# Patient Record
Sex: Male | Born: 1965 | Race: Black or African American | Hispanic: No | Marital: Single | Smoking: Current every day smoker
Health system: Southern US, Community
[De-identification: ages and names within clinical notes are randomized; demographics above are authoritative.]

## PROBLEM LIST (undated history)

## (undated) DIAGNOSIS — K635 Polyp of colon: Secondary | ICD-10-CM

## (undated) HISTORY — PX: COLONOSCOPY W/ BIOPSIES: SHX1374

## (undated) HISTORY — DX: Polyp of colon: K63.5

---

## 2007-11-22 ENCOUNTER — Emergency Department (HOSPITAL_COMMUNITY): Admission: EM | Admit: 2007-11-22 | Discharge: 2007-11-22 | Payer: Self-pay | Admitting: Family Medicine

## 2007-12-06 ENCOUNTER — Ambulatory Visit (HOSPITAL_COMMUNITY): Admission: RE | Admit: 2007-12-06 | Discharge: 2007-12-06 | Payer: Self-pay | Admitting: Chiropractic Medicine

## 2009-05-02 ENCOUNTER — Ambulatory Visit: Payer: Self-pay | Admitting: Internal Medicine

## 2009-05-02 DIAGNOSIS — IMO0002 Reserved for concepts with insufficient information to code with codable children: Secondary | ICD-10-CM | POA: Insufficient documentation

## 2009-05-21 ENCOUNTER — Encounter: Payer: Self-pay | Admitting: Internal Medicine

## 2009-06-03 ENCOUNTER — Encounter: Admission: RE | Admit: 2009-06-03 | Discharge: 2009-07-24 | Payer: Self-pay | Admitting: Internal Medicine

## 2009-06-10 ENCOUNTER — Encounter: Payer: Self-pay | Admitting: Internal Medicine

## 2010-10-24 ENCOUNTER — Emergency Department (HOSPITAL_COMMUNITY): Admission: EM | Admit: 2010-10-24 | Discharge: 2010-10-24 | Payer: Self-pay | Admitting: Emergency Medicine

## 2010-10-30 ENCOUNTER — Encounter: Payer: Self-pay | Admitting: Internal Medicine

## 2010-11-02 ENCOUNTER — Encounter: Admission: RE | Admit: 2010-11-02 | Discharge: 2010-11-02 | Payer: Self-pay | Admitting: Neurological Surgery

## 2010-11-05 ENCOUNTER — Encounter: Payer: Self-pay | Admitting: Internal Medicine

## 2010-12-21 HISTORY — PX: LUMBAR SPINE SURGERY: SHX701

## 2011-01-20 NOTE — Letter (Signed)
Summary: [facility]  [facility]   Imported By: Maryln Gottron 11/18/2010 12:39:37  _____________________________________________________________________  External Attachment:    Type:   Image     Comment:   External Document

## 2011-01-20 NOTE — Letter (Signed)
Summary: [facility]  [facility]   Imported By: Maryln Gottron 11/24/2010 12:12:37  _____________________________________________________________________  External Attachment:    Type:   Image     Comment:   External Document

## 2011-01-20 NOTE — Consult Note (Signed)
Summary: [facility]  [facility]   Imported By: Lanelle Bal 06/05/2009 12:03:23  _____________________________________________________________________  External Attachment:    Type:   Image     Comment:   External Document

## 2011-01-20 NOTE — Miscellaneous (Signed)
Summary: Initial Summary for PT Services/[facility]  Initial Summary for PT Services/[facility]   Imported By: Maryln Gottron 06/12/2009 15:09:15  _____________________________________________________________________  External Attachment:    Type:   Image     Comment:   External Document
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
## 2011-02-20 ENCOUNTER — Emergency Department (HOSPITAL_COMMUNITY)
Admission: EM | Admit: 2011-02-20 | Discharge: 2011-02-20 | Disposition: A | Discharge status: Home / Self Care | Payer: No Typology Code available for payment source | Attending: Emergency Medicine | Admitting: Emergency Medicine

## 2011-02-20 ENCOUNTER — Emergency Department (HOSPITAL_COMMUNITY): Payer: No Typology Code available for payment source

## 2011-02-20 DIAGNOSIS — S335XXA Sprain of ligaments of lumbar spine, initial encounter: Secondary | ICD-10-CM | POA: Insufficient documentation

## 2011-02-20 DIAGNOSIS — Z9889 Other specified postprocedural states: Secondary | ICD-10-CM | POA: Insufficient documentation

## 2011-02-20 DIAGNOSIS — Y92410 Unspecified street and highway as the place of occurrence of the external cause: Secondary | ICD-10-CM | POA: Insufficient documentation

## 2011-03-03 ENCOUNTER — Other Ambulatory Visit: Payer: Self-pay | Admitting: Neurosurgery

## 2011-03-03 DIAGNOSIS — M549 Dorsalgia, unspecified: Secondary | ICD-10-CM

## 2011-03-21 ENCOUNTER — Ambulatory Visit
Admission: RE | Admit: 2011-03-21 | Discharge: 2011-03-21 | Disposition: A | Discharge status: Home / Self Care | Payer: No Typology Code available for payment source | Source: Ambulatory Visit | Attending: Neurosurgery | Admitting: Neurosurgery

## 2011-03-21 ENCOUNTER — Emergency Department (HOSPITAL_BASED_OUTPATIENT_CLINIC_OR_DEPARTMENT_OTHER)
Admission: EM | Admit: 2011-03-21 | Discharge: 2011-03-21 | Disposition: A | Discharge status: Home / Self Care | Payer: 59 | Attending: Emergency Medicine | Admitting: Emergency Medicine

## 2011-03-21 DIAGNOSIS — M549 Dorsalgia, unspecified: Secondary | ICD-10-CM

## 2011-03-21 DIAGNOSIS — W57XXXA Bitten or stung by nonvenomous insect and other nonvenomous arthropods, initial encounter: Secondary | ICD-10-CM | POA: Insufficient documentation

## 2011-03-21 DIAGNOSIS — S90569A Insect bite (nonvenomous), unspecified ankle, initial encounter: Secondary | ICD-10-CM | POA: Insufficient documentation

## 2011-03-21 DIAGNOSIS — F17200 Nicotine dependence, unspecified, uncomplicated: Secondary | ICD-10-CM | POA: Insufficient documentation

## 2011-03-21 MED ORDER — GADOBENATE DIMEGLUMINE 529 MG/ML IV SOLN
19.0000 mL | Freq: Once | INTRAVENOUS | Status: AC | PRN
  Administered 2011-03-21: 19 mL via INTRAVENOUS

## 2011-06-09 ENCOUNTER — Ambulatory Visit: Payer: No Typology Code available for payment source | Admitting: Physical Therapy

## 2011-06-22 ENCOUNTER — Ambulatory Visit: Payer: 59

## 2011-07-16 ENCOUNTER — Ambulatory Visit: Payer: 59 | Attending: Neurosurgery

## 2011-07-16 DIAGNOSIS — IMO0001 Reserved for inherently not codable concepts without codable children: Secondary | ICD-10-CM | POA: Insufficient documentation

## 2011-07-16 DIAGNOSIS — M545 Low back pain, unspecified: Secondary | ICD-10-CM | POA: Insufficient documentation

## 2011-07-16 DIAGNOSIS — R293 Abnormal posture: Secondary | ICD-10-CM | POA: Insufficient documentation

## 2011-07-16 DIAGNOSIS — M79609 Pain in unspecified limb: Secondary | ICD-10-CM | POA: Insufficient documentation

## 2011-07-22 ENCOUNTER — Ambulatory Visit: Payer: 59 | Attending: Neurosurgery | Admitting: Physical Therapy

## 2011-07-22 DIAGNOSIS — M545 Low back pain, unspecified: Secondary | ICD-10-CM | POA: Insufficient documentation

## 2011-07-22 DIAGNOSIS — IMO0001 Reserved for inherently not codable concepts without codable children: Secondary | ICD-10-CM | POA: Insufficient documentation

## 2011-07-22 DIAGNOSIS — R293 Abnormal posture: Secondary | ICD-10-CM | POA: Insufficient documentation

## 2011-07-22 DIAGNOSIS — M79609 Pain in unspecified limb: Secondary | ICD-10-CM | POA: Insufficient documentation

## 2011-07-29 ENCOUNTER — Encounter: Payer: 59 | Admitting: Physical Therapy

## 2011-07-31 ENCOUNTER — Encounter: Payer: 59 | Admitting: Physical Therapy

## 2011-09-07 ENCOUNTER — Ambulatory Visit: Payer: 59 | Attending: Neurological Surgery | Admitting: Rehabilitative and Restorative Service Providers"

## 2011-09-07 DIAGNOSIS — R293 Abnormal posture: Secondary | ICD-10-CM | POA: Insufficient documentation

## 2011-09-07 DIAGNOSIS — M545 Low back pain, unspecified: Secondary | ICD-10-CM | POA: Insufficient documentation

## 2011-09-07 DIAGNOSIS — M79609 Pain in unspecified limb: Secondary | ICD-10-CM | POA: Insufficient documentation

## 2011-09-07 DIAGNOSIS — IMO0001 Reserved for inherently not codable concepts without codable children: Secondary | ICD-10-CM | POA: Insufficient documentation

## 2011-09-14 ENCOUNTER — Ambulatory Visit: Payer: 59 | Admitting: Rehabilitative and Restorative Service Providers"

## 2011-09-16 ENCOUNTER — Ambulatory Visit: Payer: 59 | Admitting: Rehabilitative and Restorative Service Providers"

## 2011-09-16 ENCOUNTER — Other Ambulatory Visit: Payer: Self-pay | Admitting: Neurosurgery

## 2011-09-16 DIAGNOSIS — M549 Dorsalgia, unspecified: Secondary | ICD-10-CM

## 2011-09-16 DIAGNOSIS — M79606 Pain in leg, unspecified: Secondary | ICD-10-CM

## 2011-09-19 ENCOUNTER — Other Ambulatory Visit: Payer: 59

## 2011-09-22 ENCOUNTER — Ambulatory Visit: Payer: 59 | Attending: Neurological Surgery | Admitting: Rehabilitative and Restorative Service Providers"

## 2011-09-22 DIAGNOSIS — M79609 Pain in unspecified limb: Secondary | ICD-10-CM | POA: Insufficient documentation

## 2011-09-22 DIAGNOSIS — M545 Low back pain, unspecified: Secondary | ICD-10-CM | POA: Insufficient documentation

## 2011-09-22 DIAGNOSIS — R293 Abnormal posture: Secondary | ICD-10-CM | POA: Insufficient documentation

## 2011-09-22 DIAGNOSIS — IMO0001 Reserved for inherently not codable concepts without codable children: Secondary | ICD-10-CM | POA: Insufficient documentation

## 2011-09-23 ENCOUNTER — Ambulatory Visit
Admission: RE | Admit: 2011-09-23 | Discharge: 2011-09-23 | Disposition: A | Discharge status: Home / Self Care | Payer: 59 | Source: Ambulatory Visit | Attending: Neurosurgery | Admitting: Neurosurgery

## 2011-09-23 DIAGNOSIS — M549 Dorsalgia, unspecified: Secondary | ICD-10-CM

## 2011-09-23 DIAGNOSIS — M79606 Pain in leg, unspecified: Secondary | ICD-10-CM

## 2011-09-24 ENCOUNTER — Ambulatory Visit: Payer: 59 | Admitting: Rehabilitative and Restorative Service Providers"

## 2011-09-28 ENCOUNTER — Encounter: Payer: 59 | Admitting: Rehabilitative and Restorative Service Providers"

## 2011-09-30 ENCOUNTER — Ambulatory Visit: Payer: 59 | Admitting: Rehabilitative and Restorative Service Providers"

## 2011-10-05 ENCOUNTER — Ambulatory Visit: Payer: 59 | Admitting: Rehabilitative and Restorative Service Providers"

## 2011-10-08 ENCOUNTER — Ambulatory Visit: Payer: 59 | Admitting: Rehabilitative and Restorative Service Providers"

## 2011-10-13 ENCOUNTER — Ambulatory Visit: Payer: 59 | Admitting: Rehabilitative and Restorative Service Providers"

## 2011-10-15 ENCOUNTER — Ambulatory Visit: Payer: 59 | Admitting: Rehabilitative and Restorative Service Providers"

## 2011-10-26 ENCOUNTER — Encounter: Payer: 59 | Admitting: Rehabilitative and Restorative Service Providers"

## 2011-10-28 ENCOUNTER — Encounter: Payer: 59 | Admitting: Rehabilitative and Restorative Service Providers"

## 2012-09-05 ENCOUNTER — Ambulatory Visit: Payer: 59 | Attending: Neurosurgery | Admitting: Physical Therapy

## 2012-09-05 DIAGNOSIS — IMO0001 Reserved for inherently not codable concepts without codable children: Secondary | ICD-10-CM | POA: Insufficient documentation

## 2012-09-05 DIAGNOSIS — R293 Abnormal posture: Secondary | ICD-10-CM | POA: Insufficient documentation

## 2012-09-05 DIAGNOSIS — M2550 Pain in unspecified joint: Secondary | ICD-10-CM | POA: Insufficient documentation

## 2012-09-05 DIAGNOSIS — R5381 Other malaise: Secondary | ICD-10-CM | POA: Insufficient documentation

## 2012-09-06 ENCOUNTER — Ambulatory Visit: Payer: 59 | Admitting: Rehabilitative and Restorative Service Providers"

## 2012-09-12 ENCOUNTER — Ambulatory Visit: Payer: 59 | Admitting: Rehabilitative and Restorative Service Providers"

## 2012-09-14 ENCOUNTER — Ambulatory Visit: Payer: 59 | Admitting: Rehabilitative and Restorative Service Providers"

## 2012-09-20 ENCOUNTER — Ambulatory Visit: Payer: 59 | Attending: Neurosurgery | Admitting: Rehabilitative and Restorative Service Providers"

## 2012-09-20 DIAGNOSIS — R5381 Other malaise: Secondary | ICD-10-CM | POA: Insufficient documentation

## 2012-09-20 DIAGNOSIS — M2550 Pain in unspecified joint: Secondary | ICD-10-CM | POA: Insufficient documentation

## 2012-09-20 DIAGNOSIS — R293 Abnormal posture: Secondary | ICD-10-CM | POA: Insufficient documentation

## 2012-09-20 DIAGNOSIS — IMO0001 Reserved for inherently not codable concepts without codable children: Secondary | ICD-10-CM | POA: Insufficient documentation

## 2012-09-22 ENCOUNTER — Encounter: Payer: 59 | Admitting: Rehabilitative and Restorative Service Providers"

## 2012-09-26 ENCOUNTER — Ambulatory Visit: Payer: 59 | Admitting: Rehabilitative and Restorative Service Providers"

## 2012-10-04 ENCOUNTER — Encounter: Payer: 59 | Admitting: Rehabilitative and Restorative Service Providers"

## 2012-10-05 ENCOUNTER — Ambulatory Visit: Payer: 59 | Admitting: Rehabilitative and Restorative Service Providers"
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
## 2012-10-11 ENCOUNTER — Ambulatory Visit: Payer: 59 | Admitting: Rehabilitative and Restorative Service Providers"

## 2012-10-12 ENCOUNTER — Ambulatory Visit: Payer: 59 | Admitting: Rehabilitative and Restorative Service Providers"

## 2012-10-17 ENCOUNTER — Encounter: Payer: 59 | Admitting: Rehabilitative and Restorative Service Providers"

## 2012-10-18 ENCOUNTER — Encounter: Payer: 59 | Admitting: Rehabilitative and Restorative Service Providers"

## 2012-10-20 ENCOUNTER — Encounter: Payer: 59 | Admitting: Rehabilitative and Restorative Service Providers"

## 2015-09-03 ENCOUNTER — Other Ambulatory Visit: Payer: Self-pay | Admitting: Nurse Practitioner

## 2015-09-03 DIAGNOSIS — R609 Edema, unspecified: Secondary | ICD-10-CM

## 2015-09-04 ENCOUNTER — Ambulatory Visit
Admission: RE | Admit: 2015-09-04 | Discharge: 2015-09-04 | Disposition: A | Discharge status: Home / Self Care | Payer: 59 | Source: Ambulatory Visit | Attending: Internal Medicine | Admitting: Internal Medicine

## 2015-09-04 DIAGNOSIS — R609 Edema, unspecified: Secondary | ICD-10-CM

## 2018-08-01 DIAGNOSIS — M25519 Pain in unspecified shoulder: Secondary | ICD-10-CM | POA: Insufficient documentation

## 2018-08-01 LAB — CBC AND DIFFERENTIAL
HCT: 46 (ref 41–53)
Hemoglobin: 15.3 (ref 13.5–17.5)
Neutrophils Absolute: 4
WBC: 9.7

## 2018-10-02 ENCOUNTER — Encounter: Payer: Self-pay | Admitting: Internal Medicine

## 2018-10-02 DIAGNOSIS — M25519 Pain in unspecified shoulder: Secondary | ICD-10-CM

## 2018-10-10 ENCOUNTER — Encounter: Payer: Self-pay | Admitting: Internal Medicine

## 2021-02-26 ENCOUNTER — Encounter: Payer: Self-pay | Admitting: Nurse Practitioner

## 2021-02-26 ENCOUNTER — Ambulatory Visit (INDEPENDENT_AMBULATORY_CARE_PROVIDER_SITE_OTHER): Payer: 59 | Admitting: Nurse Practitioner

## 2021-02-26 ENCOUNTER — Other Ambulatory Visit: Payer: Self-pay

## 2021-02-26 VITALS — BP 136/82 | HR 94 | Temp 98.7°F | Ht 71.6 in | Wt 206.6 lb

## 2021-02-26 DIAGNOSIS — Z1159 Encounter for screening for other viral diseases: Secondary | ICD-10-CM | POA: Diagnosis not present

## 2021-02-26 DIAGNOSIS — K921 Melena: Secondary | ICD-10-CM

## 2021-02-26 NOTE — Patient Instructions (Signed)
Bloody Diarrhea Bloody diarrhea is frequent loose and watery bowel movements that contain blood. The blood can be hard to see or notice (occult). Bloody diarrhea may be caused by medical conditions such as:  Ulcerative colitis.  Crohn's disease.  Intestinal infection.  Viral gastroenteritis or bacterial gastroenteritis. Finding out why there is blood in your diarrhea is necessary so that your health care provider can prescribe the right treatment for you. Follow the instructions from your health care provider about treating the cause of your bloody diarrhea. Any type of diarrhea can make you feel weak and dehydrated. Dehydration can make you tired and thirsty, cause you to have a dry mouth, and decrease how often you urinate. Follow these instructions at home: Eating and drinking Follow these recommendations as told by your health care provider:  Take an oral rehydration solution (ORS). This is an over-the-counter medicine that helps return your body to its normal balance of nutrients and water. It is found at pharmacies and retail stores.  Drink enough fluid to keep your urine pale yellow. ? Drink fluids such as water, ice chips, diluted fruit juice, and low-calorie sports drinks. You can also drink milk products, if desired. ? Avoid drinking fluids that contain a lot of sugar or caffeine, such as energy drinks, regular sports drinks, and soda. ? Avoid alcohol.  Eat bland, easy-to-digest foods in small amounts as you are able. These foods include bananas, applesauce, rice, lean meats, toast, and crackers.  Avoid spicy or fatty foods.      Medicines  Take over-the-counter and prescription medicines only as told by your health care provider. ? Your health care provider may prescribe medicine to slow down the frequency of diarrhea or to ease stomach discomfort.  If you were prescribed an antibiotic medicine, take it as told by your health care provider. Do not stop using the antibiotic  even if you start to feel better. General instructions  Wash your hands often using soap and water. If soap and water are not available, use a hand sanitizer. Others in the household should wash their hands as well. Hands should be washed: ? After using the toilet or changing a diaper. ? Before preparing, cooking, or serving food. ? While caring for a sick person or while visiting someone in a hospital.  Rest at home while you recover.  Take a warm bath to relieve any burning or pain from frequent diarrhea episodes.  Watch your condition for any changes.  Keep all follow-up visits as told by your health care provider. This is important.   Contact a health care provider if:  You have a fever.  Your diarrhea gets worse.  You have new symptoms.  You cannot keep fluids down.  You feel light-headed or dizzy.  You have a headache.  You have muscle cramps. Get help right away if:  You have chest pain.  You feel extremely weak or you faint.  The blood in your diarrhea increases or turns a different color.  You vomit and the vomit is bloody or looks black.  You have persistent diarrhea.  You have severe pain, cramping, or bloating in your abdomen.  You have trouble breathing or you are breathing very quickly.  Your heart is beating very quickly.  Your skin feels cold and clammy.  You feel confused.  You have signs of dehydration, such as: ? Dark urine, very little urine, or no urine. ? Cracked lips. ? Dry mouth. ? Sunken eyes. ? Sleepiness. ? Weakness. Summary  Bloody diarrhea is frequent loose and watery bowel movements that contain blood. The blood can be hard to see or notice (occult).  Follow the instructions from your health care provider about treating the cause of your bloody diarrhea.  Any type of diarrhea can make you feel weak and dehydrated.  Follow your health care provider's recommendations for eating and drinking and for taking  medicines.  Contact your health care provider if your symptoms get worse. Get help right away if you have signs of dehydration. This information is not intended to replace advice given to you by your health care provider. Make sure you discuss any questions you have with your health care provider. Document Revised: 05/19/2018 Document Reviewed: 05/19/2018 Elsevier Patient Education  2021 [employer].

## 2021-02-26 NOTE — Progress Notes (Signed)
I,Tianna Badgett,acting as a Education administrator for [employer], FNP.,have documented all relevant documentation on the behalf of Minette Brine, FNP,as directed by  Minette Brine, FNP while in the presence of Minette Brine, [place].  This visit occurred during the SARS-CoV-2 public health emergency.  Safety protocols were in place, including screening questions prior to the visit, additional usage of staff PPE, and extensive cleaning of exam room while observing appropriate contact time as indicated for disinfecting solutions.  Subjective:     Patient ID: Aaron [name] , male    DOB: [birth year] , 55 y.o.   MRN: [id number]   Chief Complaint  Patient presents with  . Rectal Bleeding    HPI  He has been having blood in his stool. He was to go back in 7 years.  When he ate spicy foods at times would have some spotting rectal area. The last 1 1/2, when he wipes he has bright red.  He is also seeing the blood in the bowl. The last 2 mornings he can here the blood drop in the water. He goes every morning with a bowel movement.  Denies abdomen pain.    Rectal Bleeding  The current episode started more than 2 weeks ago. The onset was sudden. The problem has been unchanged. The patient is experiencing no pain. The stool is described as hard. There was no prior successful therapy. There was no prior unsuccessful therapy. Pertinent negatives include no anorexia, no hemorrhoids, no nausea and no rectal pain.     History reviewed. No pertinent past medical history.   History reviewed. No pertinent family history.  No current outpatient medications on file.   No Known Allergies   Review of Systems  Constitutional: Negative.   Respiratory: Negative.   Cardiovascular: Negative.   Gastrointestinal: Positive for blood in stool and hematochezia. Negative for anorexia, hemorrhoids, nausea and rectal pain.  Neurological: Negative.   Psychiatric/Behavioral: Negative.      Today's Vitals   02/26/21 1615  BP: 136/82   Pulse: 94  Temp: 98.7 F (37.1 C)  TempSrc: Oral  Weight: 206 lb 9.6 oz (93.7 kg)  Height: 5' 11.6" (1.819 m)   Body mass index is 28.33 kg/m.   Objective:  Physical Exam Constitutional:      General: He is not in acute distress.    Appearance: Normal appearance.  Eyes:     Comments: His sclera has a light pale yellow tint bilaterally  Cardiovascular:     Rate and Rhythm: Normal rate and regular rhythm.     Pulses: Normal pulses.     Heart sounds: Normal heart sounds. No murmur heard.   Pulmonary:     Effort: Pulmonary effort is normal. No respiratory distress.     Breath sounds: Normal breath sounds. No wheezing.  Skin:    General: Skin is warm and dry.  Neurological:     General: No focal deficit present.     Mental Status: He is alert and oriented to person, place, and time.     Cranial Nerves: No cranial nerve deficit.     Motor: No weakness.  Psychiatric:        Mood and Affect: Mood normal.        Behavior: Behavior normal.        Thought Content: Thought content normal.        Judgment: Judgment normal.         Assessment And Plan:     1. Blood in stool  This has  restarted, given hemocult cards to bring back to office  Will refer to GI for further evaluation  He does report drinking alcohol regularly - CBC - CMP14+EGFR - Ambulatory referral to Gastroenterology  2. Encounter for hepatitis C screening test for low risk patient  Will check Hepatitis C screening due to recent recommendations to screen all adults 18 years and older - Hepatitis C antibody     Patient was given opportunity to ask questions. Patient verbalized understanding of the plan and was able to repeat key elements of the plan. All questions were answered to their satisfaction.  Minette Brine, FNP   I, Minette Brine, FNP, have reviewed all documentation for this visit. The documentation on 03/06/21 for the exam, diagnosis, procedures, and orders are all accurate and complete.    THE PATIENT IS ENCOURAGED TO PRACTICE SOCIAL DISTANCING DUE TO THE COVID-19 PANDEMIC.

## 2021-02-27 LAB — CMP14+EGFR
ALT: 19 IU/L (ref 0–44)
AST: 17 IU/L (ref 0–40)
Albumin/Globulin Ratio: 2.1 (ref 1.2–2.2)
Albumin: 4.6 g/dL (ref 3.8–4.9)
Alkaline Phosphatase: 102 IU/L (ref 44–121)
BUN/Creatinine Ratio: 9 (ref 9–20)
BUN: 7 mg/dL (ref 6–24)
Bilirubin Total: 0.4 mg/dL (ref 0.0–1.2)
CO2: 19 mmol/L — ABNORMAL LOW (ref 20–29)
Calcium: 9.3 mg/dL (ref 8.7–10.2)
Chloride: 110 mmol/L — ABNORMAL HIGH (ref 96–106)
Creatinine, Ser: 0.82 mg/dL (ref 0.76–1.27)
Globulin, Total: 2.2 g/dL (ref 1.5–4.5)
Glucose: 94 mg/dL (ref 65–99)
Potassium: 4.2 mmol/L (ref 3.5–5.2)
Sodium: 146 mmol/L — ABNORMAL HIGH (ref 134–144)
Total Protein: 6.8 g/dL (ref 6.0–8.5)
eGFR: 105 mL/min/{1.73_m2} (ref >59)

## 2021-02-27 LAB — CBC
Hematocrit: 46.6 % (ref 37.5–51.0)
Hemoglobin: 15.9 g/dL (ref 13.0–17.7)
MCH: 33.6 pg — ABNORMAL HIGH (ref 26.6–33.0)
MCHC: 34.1 g/dL (ref 31.5–35.7)
MCV: 99 fL — ABNORMAL HIGH (ref 79–97)
Platelets: 291 10*3/uL (ref 150–450)
RBC: 4.73 x10E6/uL (ref 4.14–5.80)
RDW: 11.3 % — ABNORMAL LOW (ref 11.6–15.4)
WBC: 8.9 10*3/uL (ref 3.4–10.8)

## 2021-02-27 LAB — HEPATITIS C ANTIBODY: Hep C Virus Ab: <0.1 s/co ratio (ref 0.0–0.9)

## 2021-04-16 ENCOUNTER — Encounter: Payer: Self-pay | Admitting: Nurse Practitioner

## 2021-04-16 ENCOUNTER — Other Ambulatory Visit: Payer: Self-pay

## 2021-04-16 ENCOUNTER — Encounter: Payer: 59 | Admitting: Internal Medicine

## 2021-04-16 VITALS — BP 118/80 | HR 84 | Temp 98.0°F | Ht 71.6 in | Wt 206.4 lb

## 2021-04-16 NOTE — Progress Notes (Signed)
  I,Yamilka Roman [employer] as a Neurosurgeon for Gwynneth Aliment, MD.,have documented all relevant documentation on the behalf of Gwynneth Aliment, MD,as directed by  Gwynneth Aliment, MD while in the presence of Gwynneth Aliment, MD. This visit occurred during the SARS-CoV-2 public health emergency.  Safety protocols were in place, including screening questions prior to the visit, additional usage of staff PPE, and extensive cleaning of exam room while observing appropriate contact time as indicated for disinfecting solutions.  Subjective:     Patient ID: Aaron [name] , male    DOB: [birth year] , 55 y.o.   MRN: [id number]   Chief Complaint  Patient presents with  . Annual Exam    HPI  Patient here for a full physical examination.  He does not see a urologist. He was scheduled to see Lolita Cram, DNP today for physical exam; however, declined because . He reports he did not want to have a physical exam with a PA. Pt advised Lolita Cram is actually a D-NP. I then asked he if was willing to get a Tdap today to update his immunizations. He does not want to get an injection the same day as getting bloodwork. Before I could proceed with the remainder of the exam, he stated he was going to switch MDs because he did not like my attitude. All future appts will be cancelled.     No past medical history on file.   No family history on file.  No current outpatient medications on file.   No Known Allergies   Men's preventive visit. Patient Health Questionnaire (PHQ-2) is  Flowsheet Row Office Visit from 02/26/2021 in [facility]  PHQ-2 Total Score 0    . Patient is on a  diet. Marital status: Married. Relevant history for alcohol use is:  Social History   Substance and Sexual Activity  Alcohol Use None  . Relevant history for tobacco use is:  Social History   Tobacco Use  Smoking Status Current Every Day Smoker  . Packs/day: 1.00  . Years: 13.00  . Pack years: 13.00  Smokeless  Tobacco Former Neurosurgeon  . Types: Chew  . Quit date: 10/19/1997  .   Review of Systems     Today's Vitals   04/16/21 1541  BP: 118/80  Pulse: 84  Temp: 98 F (36.7 C)  TempSrc: Oral  SpO2: 96%  Weight: 206 lb 6.4 oz (93.6 kg)  Height: 5' 11.6" (1.819 m)  PainSc: 0-No pain   Body mass index is 28.31 kg/m.   Objective:  Physical Exam      Assessment And Plan:    1. Encounter for general adult medical examination w/o abnormal findings  2. Screening for colon cancer  3. Encounter for screening for human immunodeficiency virus (HIV)  4. Immunization due     Patient was given opportunity to ask questions. Patient verbalized understanding of the plan and was able to repeat key elements of the plan. All questions were answered to their satisfaction.   I, Gwynneth Aliment, MD, have reviewed all documentation for this visit. The documentation on 04/16/21 for the exam, diagnosis, procedures, and orders are all accurate and complete.  THE PATIENT IS ENCOURAGED TO PRACTICE SOCIAL DISTANCING DUE TO THE COVID-19 PANDEMIC.

## 2021-04-16 NOTE — Patient Instructions (Signed)

## 2021-05-05 ENCOUNTER — Ambulatory Visit (INDEPENDENT_AMBULATORY_CARE_PROVIDER_SITE_OTHER): Payer: 59 | Admitting: Nurse Practitioner

## 2021-05-05 ENCOUNTER — Encounter: Payer: Self-pay | Admitting: Nurse Practitioner

## 2021-05-05 VITALS — BP 110/70 | HR 81 | Ht 72.0 in | Wt 205.0 lb

## 2021-05-05 DIAGNOSIS — K625 Hemorrhage of anus and rectum: Secondary | ICD-10-CM | POA: Diagnosis not present

## 2021-05-05 MED ORDER — NA SULFATE-K SULFATE-MG SULF 17.5-3.13-1.6 GM/177ML PO SOLN: 1.0000 | Freq: Once | ORAL | 0 refills | Status: AC

## 2021-05-05 NOTE — Progress Notes (Addendum)
ASSESSMENT AND PLAN    # 55 year old healthy male on no routine medications with a one year history of intermittent, painless rectal with bowel movements.  Denies constipation/straining.  Patient gives a history of a screening colonoscopy with Dr. Loreta Ave approximately 4 years ago at which time polyps were removed.  -- Bleeding probably anorectal in nature such as from internal hemorrhoids but need to interval development of colon polyps or mass lesions.  Patient will be scheduled for colonoscopy. The risks and benefits of colonoscopy with possible polypectomy / biopsies were discussed and the patient agrees to proceed.  -- We will request colonoscopy and biopsy reports from Dr. Kenna Gilbert office  ADDENDUM:  Reviewed records received from Dr. Loreta Ave 01/14/2018 screening colonoscopy Complete exam.  Adequate bowel prep. An 8 mm sessile polyp was removed from the distal ascending colon.  A 7 mm sessile polyp was removed from the sigmoid colon.  A diminutive polyp was found in the sigmoid colon and removed.  Terminal ileum appeared normal.  Small internal hemorrhoids found.  Distal ascending polyp pathology compatible with an inflammatory type polyp.  No dysplasia or malignancy.  Sigmoid colon polyp was hyperplastic.   Will scan reports into epic.    HISTORY OF PRESENT ILLNESS     Chief Complaint : Blood in stool  Shaye Lagace is a 55 y.o. male ,with no significant past medical history except possible colon polyps. He takes no routine medications.    Patient is a Passenger transport manager, new to the practice. He is referred by PCP for evaluation of blood in stool.  Patient has been having intermittent painless rectal bleeding with bowel movements for about a year.  Blood is anywhere from bright red to dark red on toilet tissue but sometimes in the stool.  He reports normal bowel movements without constipation or straining.  No known family history of colon cancer.  Patient has no other GI complaints such as  abdominal pain or unexplained weight loss.  Labs in early March were unremarkable.  He reports having had a screening colonoscopy with Dr. Loreta Ave approximately 4 years ago.  He says polyps were removed  Data Reviewed: 02/26/2021 Sodium 146, renal function normal, liver chemistries normal Hemoglobin 15.9, MCV 99   PREVIOUS EVALUATIONS:   Dr. Loreta Ave ~ 4 years ago. Polyps removed.     Past Medical History:  Diagnosis Date   Colon polyps    benign per patient     Past Surgical History:  Procedure Laterality Date   COLONOSCOPY W/ BIOPSIES     LUMBAR SPINE SURGERY  2012   Family History  Problem Relation Age of Onset   Heart disease Mother    Diabetes Father    Pancreatic cancer Father        patient thinks   Breast cancer Maternal Aunt    Colon cancer Neg Hx    Esophageal cancer Neg Hx    Social History   Tobacco Use   Smoking status: Current Every Day Smoker    Packs/day: 1.00    Years: 13.00    Pack years: 13.00    Types: Cigarettes   Smokeless tobacco: Former Neurosurgeon    Types: Chew    Quit date: 10/19/1997  Vaping Use   Vaping Use: Never used  Substance Use Topics   Alcohol use: Yes    Comment: 1-2 beers on occ.   Drug use: Never   No current outpatient medications on file.   No current facility-administered medications for this visit.  No Known Allergies   Review of Systems:  All systems reviewed and negative except where noted in HPI.    PHYSICAL EXAM :    Wt Readings from Last 3 Encounters:  05/05/21 205 lb (93 kg)  04/16/21 206 lb 6.4 oz (93.6 kg)  02/26/21 206 lb 9.6 oz (93.7 kg)    BP 110/70   Pulse 81   Ht 6' (1.829 m)   Wt 205 lb (93 kg)   BMI 27.80 kg/m  Constitutional:  Pleasant well developed male in no acute distress. Psychiatric: Normal mood and affect. Behavior is normal. EENT: Pupils normal.  Conjunctivae are normal. No scleral icterus. Neck supple.  Cardiovascular: Normal rate, regular rhythm. No edema Pulmonary/chest: Effort  normal and breath sounds normal. No wheezing, rales or rhonchi. Abdominal: Soft, nondistended, nontender. Bowel sounds active throughout. There are no masses palpable. No hepatomegaly. Neurological: Alert and oriented to person place and time. Skin: Skin is warm and dry. No rashes noted.  Willette Cluster, NP  05/05/2021, 10:57 AM  Cc:  Referring Providers Dorothyann Peng, MD  Arnette Felts, NP

## 2021-05-05 NOTE — Patient Instructions (Signed)
If you are age 53 or older, your body mass index should be between 23-30. Your Body mass index is 27.8 kg/m. If this is out of the aforementioned range listed, please consider follow up with your Primary Care Provider.  If you are age 55 or younger, your body mass index should be between 19-25. Your Body mass index is 27.8 kg/m. If this is out of the aformentioned range listed, please consider follow up with your Primary Care Provider.   You have been scheduled for a colonoscopy. Please follow written instructions given to you at your visit today.  Please pick up your prep supplies at the pharmacy within the next 1-3 days. If you use inhalers (even only as needed), please bring them with you on the day of your procedure.  Due to recent changes in healthcare laws, you may see the results of your imaging and laboratory studies on MyChart before your provider has had a chance to review them.  We understand that in some cases there may be results that are confusing or concerning to you. Not all laboratory results come back in the same time frame and the provider may be waiting for multiple results in order to interpret others.  Please give [place] 48 hours in order for your provider to thoroughly review all the results before contacting the office for clarification of your results.

## 2021-05-19 NOTE — Progress Notes (Signed)
Noted  

## 2021-06-02 ENCOUNTER — Telehealth: Payer: Self-pay | Admitting: Internal Medicine

## 2021-06-02 ENCOUNTER — Encounter: Payer: Self-pay | Admitting: Internal Medicine

## 2021-06-02 NOTE — Telephone Encounter (Signed)
Good morning Dr. Marina Goodell, patient called stating they have a family emergency so they rescheduled their procedure from today to 09/16/21.

## 2021-09-16 ENCOUNTER — Encounter: Payer: 59 | Admitting: Internal Medicine

## 2023-03-16 ENCOUNTER — Encounter: Payer: Self-pay | Admitting: Emergency Medicine

## 2023-03-16 ENCOUNTER — Ambulatory Visit (INDEPENDENT_AMBULATORY_CARE_PROVIDER_SITE_OTHER): Payer: 59

## 2023-03-16 ENCOUNTER — Other Ambulatory Visit: Payer: Self-pay

## 2023-03-16 ENCOUNTER — Ambulatory Visit
Admission: EM | Admit: 2023-03-16 | Discharge: 2023-03-16 | Disposition: A | Discharge status: Home / Self Care | Payer: 59 | Attending: Family Medicine | Admitting: Family Medicine

## 2023-03-16 DIAGNOSIS — M542 Cervicalgia: Secondary | ICD-10-CM | POA: Diagnosis not present

## 2023-03-16 DIAGNOSIS — M5412 Radiculopathy, cervical region: Secondary | ICD-10-CM | POA: Diagnosis not present

## 2023-03-16 DIAGNOSIS — M79601 Pain in right arm: Secondary | ICD-10-CM | POA: Diagnosis not present

## 2023-03-16 MED ORDER — METHYLPREDNISOLONE ACETATE 40 MG/ML IJ SUSP
40.0000 mg | Freq: Once | INTRAMUSCULAR | Status: AC
  Administered 2023-03-16: 40 mg via INTRAMUSCULAR

## 2023-03-16 MED ORDER — TIZANIDINE HCL 4 MG PO TABS: 4.0000 mg | ORAL_TABLET | Freq: Three times a day (TID) | ORAL | 0 refills | Status: DC | PRN

## 2023-03-16 MED ORDER — PREDNISONE 20 MG PO TABS: 40.0000 mg | ORAL_TABLET | Freq: Every day | ORAL | 0 refills | Status: AC

## 2023-03-16 MED ORDER — KETOROLAC TROMETHAMINE 30 MG/ML IJ SOLN
30.0000 mg | Freq: Once | INTRAMUSCULAR | Status: AC
  Administered 2023-03-16: 30 mg via INTRAMUSCULAR

## 2023-03-16 NOTE — ED Provider Notes (Signed)
[facility]    CSN: [id number] Arrival date & time: 03/16/23  1011      History   Chief Complaint Chief Complaint  Patient presents with   Shoulder Pain    HPI Aaron [name] is a 57 y.o. male.   Patient is here for right arm, neck, shoulder pain x 1 week.  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  Pain is constant.  Having some numbness/tingling into the thumb/hand.  The hand feels cold.  He has been taking tylenol #3 with some help.  This has never happened before.  He has lumbar surgery in 2012.        Past Medical History:  Diagnosis Date   Colon polyps    benign per patient    Patient Active Problem List   Diagnosis Date Noted   Pain in unspecified shoulder 08/01/2018   BACK PAIN, LUMBAR, WITH RADICULOPATHY 05/02/2009    Past Surgical History:  Procedure Laterality Date   COLONOSCOPY W/ BIOPSIES     LUMBAR SPINE SURGERY  2012       Home Medications    Prior to Admission medications   Not on File    Family History Family History  Problem Relation Age of Onset   Heart disease Mother    Diabetes Father    Pancreatic cancer Father        patient thinks   Breast cancer Maternal Aunt    Colon cancer Neg Hx    Esophageal cancer Neg Hx     Social History Social History   Tobacco Use   Smoking status: Every Day    Packs/day: 1.00    Years: 13.00    Additional pack years: 0.00    Total pack years: 13.00    Types: Cigarettes   Smokeless tobacco: Former    Types: Chew    Quit date: 10/19/1997  Vaping Use   Vaping Use: Never used  Substance Use Topics   Alcohol use: Yes    Comment: 1-2 beers on occ.   Drug use: Never     Allergies   Patient has no known allergies.   Review of Systems Review of Systems  Constitutional: Negative.   HENT: Negative.    Respiratory: Negative.    Cardiovascular: Negative.   Gastrointestinal: Negative.   Musculoskeletal:  Positive for neck pain.  Neurological:   Positive for numbness.  Psychiatric/Behavioral: Negative.       Physical Exam Triage Vital Signs ED Triage Vitals  Enc Vitals Group     BP 03/16/23 1040 (!) 148/98     Pulse Rate 03/16/23 1040 99     Resp 03/16/23 1040 18     Temp 03/16/23 1040 98.4 F (36.9 C)     Temp Source 03/16/23 1040 Oral     SpO2 03/16/23 1040 97 %     Weight --      Height --      Head Circumference --      Peak Flow --      Pain Score 03/16/23 1041 7     Pain Loc --      Pain Edu? --      Excl. in [place]? --    No data found.  Updated Vital Signs BP (!) 148/98 (BP Location: Left Arm)   Pulse 99   Temp 98.4 F (36.9 C) (Oral)   Resp 18   SpO2 97%   Visual Acuity Right Eye Distance:   Left Eye Distance:  Bilateral Distance:    Right Eye Near:   Left Eye Near:    Bilateral Near:     Physical Exam Constitutional:      General: He is not in acute distress.    Appearance: Normal appearance. He is not ill-appearing.  Cardiovascular:     Rate and Rhythm: Normal rate.  Pulmonary:     Effort: Pulmonary effort is normal.  Musculoskeletal:     Comments: No TTP to the cervical spine.   + mild TTP to the right trapezius;   Full rom of the neck with pain with flexion or rotation of the neck;  Unable to access spurlings due to pain with flexion of the neck  Neurological:     General: No focal deficit present.     Mental Status: He is alert.     Sensory: No sensory deficit.     Motor: No weakness.  Psychiatric:        Mood and Affect: Mood normal.      UC Treatments / Results  Labs (all labs ordered are listed, but only abnormal results are displayed) Labs Reviewed - No data to display  EKG   Radiology DG Cervical Spine Complete  Result Date: 03/16/2023 CLINICAL DATA:  Neck pain EXAM: CERVICAL SPINE - COMPLETE 4+ VIEW COMPARISON:  None Available. FINDINGS: Straightening of the normal cervical lordosis. Disc spaces are preserved. Vertebral body heights are preserved. No significant  facet degenerative change. Lung apices are clear. No discernible soft tissue abnormality. IMPRESSION: Negative cervical spine radiographs. Electronically Signed   By: Marin Roberts M.D.   On: 03/16/2023 11:18    Procedures Procedures (including critical care time)  Medications Ordered in UC Medications  ketorolac (TORADOL) 30 MG/ML injection 30 mg (has no administration in time range)  methylPREDNISolone acetate (DEPO-MEDROL) injection 40 mg (has no administration in time range)    Initial Impression / Assessment and Plan / UC Course  I have reviewed the triage vital signs and the nursing notes.  Pertinent labs & imaging results that were available during my care of the patient were reviewed by me and considered in my medical decision making (see chart for details).  Final Clinical Impressions(s) / UC Diagnoses   Final diagnoses:  Cervical radiculopathy  Neck pain  Right arm pain     Discharge Instructions      You were seen today for neck pain radiating down the right arm.  This is likely a pinched nerve causing issues.  Your xray was normal.   I have given you a shot of pain medication and a shot of a steroid here today.  I have sent scripts for a muscle relaxer (which can make you tired/sleepy so please take when home and not driving) as well as an oral steroid to start tomorrow for swelling and inflammation.  You may use heat/ice for pain as well.  If you continue with pain then please follow up with your primary care provider for further care/discussion.     ED Prescriptions     Medication Sig Dispense Auth. Provider   tiZANidine (ZANAFLEX) 4 MG tablet Take 1 tablet (4 mg total) by mouth every 8 (eight) hours as needed for muscle spasms. 30 tablet Vicie Cech, MD   predniSONE (DELTASONE) 20 MG tablet Take 2 tablets (40 mg total) by mouth daily for 5 days. 10 tablet Rondel Oh, MD      PDMP not reviewed this encounter.   Rondel Oh, MD 03/16/23 1136

## 2023-03-16 NOTE — ED Triage Notes (Signed)
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

## 2023-03-16 NOTE — Discharge Instructions (Addendum)
You were seen today for neck pain radiating down the right arm.  This is likely a pinched nerve causing issues.  Your xray was normal.   I have given you a shot of pain medication and a shot of a steroid here today.  I have sent scripts for a muscle relaxer (which can make you tired/sleepy so please take when home and not driving) as well as an oral steroid to start tomorrow for swelling and inflammation.  You may use heat/ice for pain as well.  If you continue with pain then please follow up with your primary care provider for further care/discussion.

## 2024-09-27 ENCOUNTER — Other Ambulatory Visit: Payer: Self-pay

## 2024-09-27 ENCOUNTER — Emergency Department (HOSPITAL_BASED_OUTPATIENT_CLINIC_OR_DEPARTMENT_OTHER)
Admission: EM | Admit: 2024-09-27 | Discharge: 2024-09-28 | Disposition: A | Discharge status: Home / Self Care | Payer: Self-pay | Attending: Emergency Medicine | Admitting: Emergency Medicine

## 2024-09-27 DIAGNOSIS — K644 Residual hemorrhoidal skin tags: Secondary | ICD-10-CM | POA: Diagnosis present

## 2024-09-27 MED ORDER — LIDOCAINE VISCOUS HCL 2 % MT SOLN
15.0000 mL | Freq: Once | OROMUCOSAL | Status: AC
  Administered 2024-09-27: 15 mL via TOPICAL
  Filled 2024-09-27: qty 15

## 2024-09-27 MED ORDER — LIDOCAINE VISCOUS HCL 2 % MT SOLN: 15.0000 mL | OROMUCOSAL | 0 refills | Status: DC | PRN

## 2024-09-27 NOTE — Discharge Instructions (Addendum)
 Thank you for letting us  evaluate you today.  It appears that you have an external hemorrhoid.  Please continue using Preparation H to reduce size.  Have also sent lidocaine solution which is for pain.  Please use sitz bath's to reduce inflammation and relax sphincter muscles.  I provided you with general surgery follow-up for definitive/surgical treatment of hemorrhoid.  You may also use hemorrhoid pillow found over-the-counter on [employer] to reduce pain when sitting. Continue stool softeners, high fiber diet, and plenty of water  Return to ED if you experience worsening pain, significant bleeding, worsening symptoms but ultimately need to follow up with surgeon for removal

## 2024-09-27 NOTE — ED Provider Notes (Signed)
 Aaron [name] Provider Note   CSN: [id number] Arrival date & time: 09/27/24  1943     Patient presents with: Hemorrhoids   Aaron [name] is a 58 y.o. male with past medical history of internal hemorrhoids, colonic polyps presents Emergency Department for evaluation of external hemorrhoid, rectal pain with defecation for the past 3 weeks.  Has been using hemorrhoid cream, Tylenol, ibuprofen, stool softeners for the past 2 weeks without much relief.  Denies urinary symptoms, hematuria, pain with urination, fevers   HPI     Prior to Admission medications   Medication Sig Start Date End Date Taking? Authorizing Provider  hydrocortisone (ANUSOL-HC) 2.5 % rectal cream Place 1 Application rectally 2 (two) times daily. 09/28/24  Yes Minnie Tinnie BRAVO, PA  lidocaine (XYLOCAINE) 2 % solution Use as directed 15 mLs in the mouth or throat as needed (rectal pain). 09/28/24   Minnie Tinnie BRAVO, PA  tiZANidine  (ZANAFLEX ) 4 MG tablet Take 1 tablet (4 mg total) by mouth every 8 (eight) hours as needed for muscle spasms. 03/16/23   Darral Longs, MD    Allergies: Patient has no known allergies.    Review of Systems  Gastrointestinal:  Positive for rectal pain.    Updated Vital Signs BP 125/87 (BP Location: Right Arm)   Pulse 76   Temp 98.3 F (36.8 C) (Oral)   Resp 17   Ht 6' (1.829 m)   SpO2 99%   BMI 27.80 kg/m   Physical Exam Vitals and nursing note reviewed. Exam conducted with a chaperone present.  Constitutional:      General: He is not in acute distress.    Appearance: Normal appearance.  HENT:     Head: Normocephalic and atraumatic.  Eyes:     Conjunctiva/sclera: Conjunctivae normal.  Cardiovascular:     Rate and Rhythm: Normal rate.  Pulmonary:     Effort: Pulmonary effort is normal. No respiratory distress.  Abdominal:     Tenderness: There is no abdominal tenderness. There is no right CVA tenderness, left CVA tenderness, guarding  or rebound.  Genitourinary:    Comments: External nonthrombosed hemorrhoid that is mildly tender to palpation. No obvious fissure on external exam. No blood nor melena from rectum Skin:    Coloration: Skin is not jaundiced or pale.  Neurological:     Mental Status: He is alert. Mental status is at baseline.   Delanna Polite RN chaperoned GU exam  (all labs ordered are listed, but only abnormal results are displayed) Labs Reviewed - No data to display  EKG: None  Radiology: No results found.    Medications Ordered in the ED  lidocaine (XYLOCAINE) 2 % viscous mouth solution 15 mL (15 mLs Topical Given 09/27/24 2353)                                    Medical Decision Making Risk Prescription drug management.   Patient presents to the ED for concern of rectal pain, hemorrhoid, this involves an extensive number of treatment options, and is a complaint that carries with it a high risk of complications and morbidity.  The differential diagnosis includes thrombosed hemorrhoid, rectal fissure, external/internal hemorrhoid, prostatitis    Co morbidities that complicate the patient evaluation  Internal hemorrhoids   Additional history obtained:  Additional history obtained from Nursing   External records from outside source obtained and reviewed including triage RN note  Medicines ordered and prescription drug management:  I ordered medication including lidocaine, anusol  for external hemorrhoid  Reevaluation of the patient after these medicines showed that the patient improved I have reviewed the patients home medicines and have made adjustments as needed    Problem List / ED Course:  External hemorrhoid VS WNL Non thrombosed. Mildly tender. No obvious fissure on exam No pain with urination, retention, fever, chills. Low suspicion for prostatitis Colonoscopy from 2019 significant for internal hemorrhoids and colonic polys Provided anusol cream and lidocaine cream  for symptomatic treatment. Provided sitz bath information on DC paperwork Provided GI and general surgery f/u for definitive tx of hemorrhoid   Reevaluation:  After the interventions noted above, I reevaluated the patient and found that they have :improved    Dispostion:  After consideration of the diagnostic results and the patients response to treatment, I feel that the patent would benefit from outpatient management with GI/general surgery follow-up.   Discussed ED workup, disposition, return to ED precautions with patient who expresses understanding agrees with plan.  All questions answered to their satisfaction.  They are agreeable to plan.  Discharge instructions provided on paperwork  Final diagnoses:  External hemorrhoid    ED Discharge Orders          Ordered    lidocaine (XYLOCAINE) 2 % solution  As needed        09/28/24 0015    hydrocortisone (ANUSOL-HC) 2.5 % rectal cream  2 times daily        09/28/24 0020    lidocaine (XYLOCAINE) 2 % solution  As needed,   Status:  Discontinued        09/27/24 2230             Minnie Tinnie BRAVO, PA 09/29/24 9790    Mannie Pac T, DO 10/03/24 [phone]

## 2024-09-27 NOTE — ED Triage Notes (Signed)
 Pt reporting pain from hemorrhoids, worsened x1 week.

## 2024-09-27 NOTE — ED Notes (Signed)
 Reviewed discharge instructions, medications, and home care with pt. Pt verbalized understanding and had no further questions. Pt exited ED without complications.

## 2024-09-28 MED ORDER — LIDOCAINE VISCOUS HCL 2 % MT SOLN: 15.0000 mL | OROMUCOSAL | 0 refills | Status: DC | PRN

## 2024-09-28 MED ORDER — HYDROCORTISONE (PERIANAL) 2.5 % EX CREA: 1.0000 | TOPICAL_CREAM | Freq: Two times a day (BID) | CUTANEOUS | 0 refills | Status: DC

## 2025-01-10 ENCOUNTER — Encounter (HOSPITAL_COMMUNITY)
Admission: EM | Disposition: A | Discharge status: Home / Self Care | Payer: Self-pay | Source: Home / Self Care | Attending: Internal Medicine

## 2025-01-10 ENCOUNTER — Inpatient Hospital Stay (HOSPITAL_COMMUNITY)
Admission: EM | Admit: 2025-01-10 | Discharge: 2025-01-25 | DRG: 321 | Disposition: A | Discharge status: Home / Self Care | Attending: Cardiology | Admitting: Cardiology

## 2025-01-10 ENCOUNTER — Other Ambulatory Visit (HOSPITAL_COMMUNITY): Payer: Self-pay

## 2025-01-10 ENCOUNTER — Telehealth (HOSPITAL_COMMUNITY): Payer: Self-pay

## 2025-01-10 DIAGNOSIS — N2889 Other specified disorders of kidney and ureter: Secondary | ICD-10-CM | POA: Diagnosis present

## 2025-01-10 DIAGNOSIS — E876 Hypokalemia: Secondary | ICD-10-CM | POA: Diagnosis present

## 2025-01-10 DIAGNOSIS — I2699 Other pulmonary embolism without acute cor pulmonale: Secondary | ICD-10-CM | POA: Insufficient documentation

## 2025-01-10 DIAGNOSIS — I2510 Atherosclerotic heart disease of native coronary artery without angina pectoris: Secondary | ICD-10-CM | POA: Diagnosis present

## 2025-01-10 DIAGNOSIS — I97630 Postprocedural hematoma of a circulatory system organ or structure following a cardiac catheterization: Secondary | ICD-10-CM | POA: Insufficient documentation

## 2025-01-10 DIAGNOSIS — K648 Other hemorrhoids: Secondary | ICD-10-CM | POA: Diagnosis present

## 2025-01-10 DIAGNOSIS — I82432 Acute embolism and thrombosis of left popliteal vein: Secondary | ICD-10-CM | POA: Diagnosis present

## 2025-01-10 DIAGNOSIS — K5903 Drug induced constipation: Secondary | ICD-10-CM | POA: Diagnosis not present

## 2025-01-10 DIAGNOSIS — F17210 Nicotine dependence, cigarettes, uncomplicated: Secondary | ICD-10-CM | POA: Diagnosis present

## 2025-01-10 DIAGNOSIS — I472 Ventricular tachycardia, unspecified: Secondary | ICD-10-CM | POA: Diagnosis present

## 2025-01-10 DIAGNOSIS — R112 Nausea with vomiting, unspecified: Secondary | ICD-10-CM | POA: Diagnosis not present

## 2025-01-10 DIAGNOSIS — E785 Hyperlipidemia, unspecified: Secondary | ICD-10-CM | POA: Diagnosis present

## 2025-01-10 DIAGNOSIS — I358 Other nonrheumatic aortic valve disorders: Secondary | ICD-10-CM | POA: Diagnosis present

## 2025-01-10 DIAGNOSIS — I5021 Acute systolic (congestive) heart failure: Secondary | ICD-10-CM | POA: Diagnosis present

## 2025-01-10 DIAGNOSIS — Z860100 Personal history of colon polyps, unspecified: Secondary | ICD-10-CM

## 2025-01-10 DIAGNOSIS — Z7901 Long term (current) use of anticoagulants: Secondary | ICD-10-CM

## 2025-01-10 DIAGNOSIS — D72829 Elevated white blood cell count, unspecified: Secondary | ICD-10-CM | POA: Diagnosis present

## 2025-01-10 DIAGNOSIS — I82452 Acute embolism and thrombosis of left peroneal vein: Secondary | ICD-10-CM | POA: Diagnosis not present

## 2025-01-10 DIAGNOSIS — Z8249 Family history of ischemic heart disease and other diseases of the circulatory system: Secondary | ICD-10-CM

## 2025-01-10 DIAGNOSIS — S3012XA Contusion of groin, initial encounter: Secondary | ICD-10-CM | POA: Insufficient documentation

## 2025-01-10 DIAGNOSIS — Z800 Family history of malignant neoplasm of digestive organs: Secondary | ICD-10-CM

## 2025-01-10 DIAGNOSIS — Y840 Cardiac catheterization as the cause of abnormal reaction of the patient, or of later complication, without mention of misadventure at the time of the procedure: Secondary | ICD-10-CM | POA: Diagnosis not present

## 2025-01-10 DIAGNOSIS — Z955 Presence of coronary angioplasty implant and graft: Secondary | ICD-10-CM

## 2025-01-10 DIAGNOSIS — I700 Atherosclerosis of aorta: Secondary | ICD-10-CM | POA: Diagnosis present

## 2025-01-10 DIAGNOSIS — I2102 ST elevation (STEMI) myocardial infarction involving left anterior descending coronary artery: Principal | ICD-10-CM | POA: Diagnosis present

## 2025-01-10 DIAGNOSIS — N5089 Other specified disorders of the male genital organs: Secondary | ICD-10-CM | POA: Diagnosis not present

## 2025-01-10 DIAGNOSIS — Z803 Family history of malignant neoplasm of breast: Secondary | ICD-10-CM

## 2025-01-10 DIAGNOSIS — T40605A Adverse effect of unspecified narcotics, initial encounter: Secondary | ICD-10-CM | POA: Diagnosis not present

## 2025-01-10 DIAGNOSIS — J189 Pneumonia, unspecified organism: Secondary | ICD-10-CM | POA: Diagnosis not present

## 2025-01-10 DIAGNOSIS — Z833 Family history of diabetes mellitus: Secondary | ICD-10-CM

## 2025-01-10 DIAGNOSIS — F32A Depression, unspecified: Secondary | ICD-10-CM | POA: Diagnosis present

## 2025-01-10 DIAGNOSIS — K5730 Diverticulosis of large intestine without perforation or abscess without bleeding: Secondary | ICD-10-CM | POA: Diagnosis present

## 2025-01-10 DIAGNOSIS — D649 Anemia, unspecified: Secondary | ICD-10-CM | POA: Diagnosis not present

## 2025-01-10 DIAGNOSIS — R0902 Hypoxemia: Secondary | ICD-10-CM | POA: Diagnosis not present

## 2025-01-10 DIAGNOSIS — Z79899 Other long term (current) drug therapy: Secondary | ICD-10-CM

## 2025-01-10 DIAGNOSIS — I2109 ST elevation (STEMI) myocardial infarction involving other coronary artery of anterior wall: Principal | ICD-10-CM | POA: Diagnosis present

## 2025-01-10 DIAGNOSIS — Z7982 Long term (current) use of aspirin: Secondary | ICD-10-CM

## 2025-01-10 DIAGNOSIS — Z7902 Long term (current) use of antithrombotics/antiplatelets: Secondary | ICD-10-CM

## 2025-01-10 HISTORY — PX: LEFT HEART CATH AND CORONARY ANGIOGRAPHY: CATH118249

## 2025-01-10 HISTORY — PX: CORONARY/GRAFT ACUTE MI REVASCULARIZATION: CATH118305

## 2025-01-10 LAB — COMPREHENSIVE METABOLIC PANEL WITH GFR
ALT: 17 U/L (ref 0–44)
AST: 22 U/L (ref 15–41)
Albumin: 3.9 g/dL (ref 3.5–5.0)
Alkaline Phosphatase: 78 U/L (ref 38–126)
Anion gap: 18 — ABNORMAL HIGH (ref 5–15)
BUN: 12 mg/dL (ref 6–20)
CO2: 20 mmol/L — ABNORMAL LOW (ref 22–32)
Calcium: 8 mg/dL — ABNORMAL LOW (ref 8.9–10.3)
Chloride: 108 mmol/L (ref 98–111)
Creatinine, Ser: 0.78 mg/dL (ref 0.61–1.24)
GFR, Estimated: >60 mL/min
Glucose, Bld: 131 mg/dL — ABNORMAL HIGH (ref 70–99)
Potassium: 2.9 mmol/L — ABNORMAL LOW (ref 3.5–5.1)
Sodium: 146 mmol/L — ABNORMAL HIGH (ref 135–145)
Total Bilirubin: 0.3 mg/dL (ref 0.0–1.2)
Total Protein: 6 g/dL — ABNORMAL LOW (ref 6.5–8.1)

## 2025-01-10 LAB — CBC WITH DIFFERENTIAL/PLATELET
Abs Immature Granulocytes: 0.06 K/uL (ref 0.00–0.07)
Basophils Absolute: 0.1 K/uL (ref 0.0–0.1)
Basophils Relative: 0 %
Eosinophils Absolute: 0.1 K/uL (ref 0.0–0.5)
Eosinophils Relative: 1 %
HCT: 39.7 % (ref 39.0–52.0)
Hemoglobin: 14.1 g/dL (ref 13.0–17.0)
Immature Granulocytes: 1 %
Lymphocytes Relative: 23 %
Lymphs Abs: 3 K/uL (ref 0.7–4.0)
MCH: 33.9 pg (ref 26.0–34.0)
MCHC: 35.5 g/dL (ref 30.0–36.0)
MCV: 95.4 fL (ref 80.0–100.0)
Monocytes Absolute: 0.9 K/uL (ref 0.1–1.0)
Monocytes Relative: 7 %
Neutro Abs: 8.8 K/uL — ABNORMAL HIGH (ref 1.7–7.7)
Neutrophils Relative %: 68 %
Platelets: 264 K/uL (ref 150–400)
RBC: 4.16 MIL/uL — ABNORMAL LOW (ref 4.22–5.81)
RDW: 11.5 % (ref 11.5–15.5)
WBC: 12.9 K/uL — ABNORMAL HIGH (ref 4.0–10.5)
nRBC: 0 % (ref 0.0–0.2)

## 2025-01-10 LAB — LIPID PANEL
Cholesterol: 142 mg/dL (ref 0–200)
HDL: 35 mg/dL — ABNORMAL LOW
LDL Cholesterol: 82 mg/dL (ref 0–99)
Total CHOL/HDL Ratio: 4.1 ratio
Triglycerides: 129 mg/dL
VLDL: 26 mg/dL (ref 0–40)

## 2025-01-10 LAB — APTT: aPTT: 146 s — ABNORMAL HIGH (ref 24–36)

## 2025-01-10 LAB — PROTIME-INR
INR: 1.1 (ref 0.8–1.2)
Prothrombin Time: 15.3 s — ABNORMAL HIGH (ref 11.4–15.2)

## 2025-01-10 LAB — HEMOGLOBIN A1C
Hgb A1c MFr Bld: 5 % (ref 4.8–5.6)
Mean Plasma Glucose: 96.8 mg/dL

## 2025-01-10 LAB — MRSA NEXT GEN BY PCR, NASAL: MRSA by PCR Next Gen: NOT DETECTED

## 2025-01-10 LAB — POCT ACTIVATED CLOTTING TIME
Activated Clotting Time: 230 s
Activated Clotting Time: 122 s
Activated Clotting Time: 209 s

## 2025-01-10 LAB — TROPONIN T, HIGH SENSITIVITY: Troponin T High Sensitivity: 25 ng/L — ABNORMAL HIGH (ref 0–19)

## 2025-01-10 MED ORDER — HEPARIN SODIUM (PORCINE) 1000 UNIT/ML IJ SOLN
INTRAMUSCULAR | Status: AC
  Filled 2025-01-10: qty 10

## 2025-01-10 MED ORDER — POTASSIUM CHLORIDE CRYS ER 20 MEQ PO TBCR: 40.0000 meq | EXTENDED_RELEASE_TABLET | Freq: Once | ORAL | Status: DC

## 2025-01-10 MED ORDER — IOHEXOL 350 MG/ML SOLN
INTRAVENOUS | Status: DC | PRN
  Administered 2025-01-10: 110 mL

## 2025-01-10 MED ORDER — NITROGLYCERIN 0.4 MG SL SUBL: 0.4000 mg | SUBLINGUAL_TABLET | SUBLINGUAL | Status: DC | PRN

## 2025-01-10 MED ORDER — MIDAZOLAM HCL 2 MG/2ML IJ SOLN
INTRAMUSCULAR | Status: AC
  Filled 2025-01-10: qty 2

## 2025-01-10 MED ORDER — OXYCODONE HCL 5 MG PO TABS: 5.0000 mg | ORAL_TABLET | ORAL | Status: DC | PRN

## 2025-01-10 MED ORDER — LIDOCAINE HCL (PF) 1 % IJ SOLN
INTRAMUSCULAR | Status: AC
  Filled 2025-01-10: qty 30

## 2025-01-10 MED ORDER — OXYCODONE HCL 5 MG PO TABS
5.0000 mg | ORAL_TABLET | Freq: Once | ORAL | Status: AC | PRN
  Administered 2025-01-10: 5 mg via ORAL
  Filled 2025-01-10: qty 1

## 2025-01-10 MED ORDER — ATROPINE SULFATE 1 MG/10ML IJ SOSY
PREFILLED_SYRINGE | INTRAMUSCULAR | Status: AC
  Filled 2025-01-10: qty 10

## 2025-01-10 MED ORDER — VERAPAMIL HCL 2.5 MG/ML IV SOLN
INTRAVENOUS | Status: AC
  Filled 2025-01-10: qty 2

## 2025-01-10 MED ORDER — HYDRALAZINE HCL 20 MG/ML IJ SOLN: 10.0000 mg | INTRAMUSCULAR | Status: AC | PRN

## 2025-01-10 MED ORDER — CHLORHEXIDINE GLUCONATE CLOTH 2 % EX PADS
6.0000 | MEDICATED_PAD | Freq: Every day | CUTANEOUS | Status: DC
  Administered 2025-01-10 – 2025-01-14 (×5): 6 via TOPICAL

## 2025-01-10 MED ORDER — ACETAMINOPHEN 325 MG PO TABS
975.0000 mg | ORAL_TABLET | Freq: Once | ORAL | Status: AC
  Administered 2025-01-10: 975 mg via ORAL
  Filled 2025-01-10: qty 3

## 2025-01-10 MED ORDER — FENTANYL CITRATE (PF) 100 MCG/2ML IJ SOLN
INTRAMUSCULAR | Status: AC
  Filled 2025-01-10: qty 2

## 2025-01-10 MED ORDER — TICAGRELOR 90 MG PO TABS
90.0000 mg | ORAL_TABLET | Freq: Two times a day (BID) | ORAL | Status: AC
  Administered 2025-01-10 – 2025-01-17 (×15): 90 mg via ORAL
  Filled 2025-01-10 (×15): qty 1

## 2025-01-10 MED ORDER — MIDAZOLAM HCL (PF) 2 MG/2ML IJ SOLN
INTRAMUSCULAR | Status: DC | PRN
  Administered 2025-01-10 (×3): 1 mg via INTRAVENOUS

## 2025-01-10 MED ORDER — TICAGRELOR 90 MG PO TABS
ORAL_TABLET | ORAL | Status: AC
  Filled 2025-01-10: qty 2

## 2025-01-10 MED ORDER — ONDANSETRON HCL 4 MG/2ML IJ SOLN
4.0000 mg | Freq: Four times a day (QID) | INTRAMUSCULAR | Status: DC | PRN
  Administered 2025-01-11 – 2025-01-19 (×2): 4 mg via INTRAVENOUS
  Filled 2025-01-10 (×2): qty 2

## 2025-01-10 MED ORDER — POTASSIUM CHLORIDE 10 MEQ/100ML IV SOLN
INTRAVENOUS | Status: AC
  Administered 2025-01-10: 10 meq
  Filled 2025-01-10: qty 100

## 2025-01-10 MED ORDER — LIDOCAINE HCL (PF) 1 % IJ SOLN
INTRAMUSCULAR | Status: DC | PRN
  Administered 2025-01-10 (×3): 5 mL via INTRADERMAL

## 2025-01-10 MED ORDER — METOPROLOL SUCCINATE ER 25 MG PO TB24
12.5000 mg | ORAL_TABLET | Freq: Every day | ORAL | Status: DC
  Administered 2025-01-10 – 2025-01-12 (×3): 12.5 mg via ORAL
  Filled 2025-01-10 (×3): qty 1

## 2025-01-10 MED ORDER — POTASSIUM CHLORIDE CRYS ER 20 MEQ PO TBCR
EXTENDED_RELEASE_TABLET | ORAL | Status: AC
  Filled 2025-01-10: qty 2

## 2025-01-10 MED ORDER — POTASSIUM CHLORIDE 10 MEQ/100ML IV SOLN
10.0000 meq | INTRAVENOUS | Status: AC
  Administered 2025-01-10 – 2025-01-11 (×3): 10 meq via INTRAVENOUS
  Filled 2025-01-10 (×3): qty 100

## 2025-01-10 MED ORDER — NITROGLYCERIN 1 MG/10 ML FOR IR/CATH LAB
INTRA_ARTERIAL | Status: AC
  Filled 2025-01-10: qty 10

## 2025-01-10 MED ORDER — HEPARIN SODIUM (PORCINE) 1000 UNIT/ML IJ SOLN
INTRAMUSCULAR | Status: DC | PRN
  Administered 2025-01-10: 1000 [IU] via INTRAVENOUS
  Administered 2025-01-10: 3000 [IU] via INTRAVENOUS
  Administered 2025-01-10: 4000 [IU] via INTRAVENOUS
  Administered 2025-01-10: 3000 [IU] via INTRAVENOUS
  Administered 2025-01-10: 5000 [IU] via INTRAVENOUS

## 2025-01-10 MED ORDER — HEPARIN (PORCINE) IN NACL 1000-0.9 UT/500ML-% IV SOLN
INTRAVENOUS | Status: DC | PRN
  Administered 2025-01-10: 1000 mL

## 2025-01-10 MED ORDER — SODIUM CHLORIDE 0.9% FLUSH
3.0000 mL | Freq: Two times a day (BID) | INTRAVENOUS | Status: DC
  Administered 2025-01-11 – 2025-01-25 (×29): 3 mL via INTRAVENOUS

## 2025-01-10 MED ORDER — SODIUM CHLORIDE 0.9% FLUSH
3.0000 mL | INTRAVENOUS | Status: DC | PRN
  Administered 2025-01-14: 3 mL via INTRAVENOUS

## 2025-01-10 MED ORDER — TICAGRELOR 90 MG PO TABS
ORAL_TABLET | ORAL | Status: DC | PRN
  Administered 2025-01-10: 180 mg via ORAL

## 2025-01-10 MED ORDER — ASPIRIN 81 MG PO CHEW
81.0000 mg | CHEWABLE_TABLET | Freq: Every day | ORAL | Status: DC
  Administered 2025-01-11 – 2025-01-23 (×13): 81 mg via ORAL
  Filled 2025-01-10 (×13): qty 1

## 2025-01-10 MED ORDER — LABETALOL HCL 5 MG/ML IV SOLN
10.0000 mg | INTRAVENOUS | Status: AC | PRN
  Filled 2025-01-10: qty 4

## 2025-01-10 MED ORDER — ATORVASTATIN CALCIUM 80 MG PO TABS
80.0000 mg | ORAL_TABLET | Freq: Every day | ORAL | Status: DC
  Administered 2025-01-10 – 2025-01-25 (×16): 80 mg via ORAL
  Filled 2025-01-10 (×16): qty 1

## 2025-01-10 MED ORDER — FENTANYL CITRATE (PF) 100 MCG/2ML IJ SOLN
INTRAMUSCULAR | Status: DC | PRN
  Administered 2025-01-10 (×3): 25 ug via INTRAVENOUS

## 2025-01-10 MED ORDER — SODIUM CHLORIDE 0.9 % IV SOLN: 250.0000 mL | INTRAVENOUS | Status: AC | PRN

## 2025-01-10 MED ORDER — ACETAMINOPHEN 325 MG PO TABS
650.0000 mg | ORAL_TABLET | ORAL | Status: DC | PRN
  Administered 2025-01-10 – 2025-01-11 (×3): 650 mg via ORAL
  Filled 2025-01-10 (×3): qty 2

## 2025-01-10 NOTE — H&P (Addendum)
 "  Cardiology Admission History and Physical   Patient ID: Aaron [name] MRN: [id number]; DOB: [birth year]   Admission date: 01/10/2025  PCP:  Zachary Lamar BRAVO, NP   [facility] Providers Cardiologist:  Lurena MARLA Red, MD     Chief Complaint:  Chest pain/STEMI  Patient Profile: Aaron [name] is a 59 y.o. male with PMH of hemorrhoid and recent tooth infection who is being seen 01/10/2025 for the evaluation of STEMI.  History of Present Illness: Aaron [name] is a 59 yo male with PMH noted above. Reports issues with intermittent bright red rectal bleeding in the setting of prolapsed hemorrhoid for which he was recently seen by general surgery but opted for medical management. Also reports a recent tooth infection back in December which he finished antibiotics.   Reports he was in his usual state of health until today. Was planning to meet some friends when he walked out to his car and experienced left sided chest pain with radiation down the left arm. EMS was called, on scene he was noted to be diaphoretic, clammy. C/o chest pressure. EKG @ 1333 initially showed sinus rhythm with j point elevation with repeat @1404  showing ST elevation in anterolateral leads. Given 324mg  ASA en route. Brought directly up to the cath lab for emergent cardiac cath.    Past Medical History:  Diagnosis Date   Colon polyps    benign per patient   Past Surgical History:  Procedure Laterality Date   COLONOSCOPY W/ BIOPSIES     LUMBAR SPINE SURGERY  2012     Medications Prior to Admission: Prior to Admission medications  Medication Sig Start Date End Date Taking? Authorizing Provider  hydrocortisone  (ANUSOL -HC) 2.5 % rectal cream Place 1 Application rectally 2 (two) times daily. 09/28/24   Minnie Tinnie BRAVO, PA  lidocaine  (XYLOCAINE ) 2 % solution Use as directed 15 mLs in the mouth or throat as needed (rectal pain). 09/28/24   Minnie Tinnie BRAVO, PA  tiZANidine  (ZANAFLEX ) 4 MG tablet Take 1 tablet (4 mg  total) by mouth every 8 (eight) hours as needed for muscle spasms. 03/16/23   Darral Longs, MD     Allergies:   Allergies[1]  Social History:   Social History   Socioeconomic History   Marital status: Single    Spouse name: Not on file   Number of children: 0   Years of education: Not on file   Highest education level: Not on file  Occupational History   Occupation: Rancher  Tobacco Use   Smoking status: Every Day    Current packs/day: 1.00    Average packs/day: 1 pack/day for 13.0 years (13.0 ttl pk-yrs)    Types: Cigarettes   Smokeless tobacco: Former    Types: Chew    Quit date: 10/19/1997  Vaping Use   Vaping status: Never Used  Substance and Sexual Activity   Alcohol use: Yes    Comment: 1-2 beers on occ.   Drug use: Never   Sexual activity: Not on file  Other Topics Concern   Not on file  Social History Narrative   Not on file   Social Drivers of Health   Tobacco Use: High Risk (10/31/2024)   Received from [facility]   Patient History    Smoking Tobacco Use: Every Day    Smokeless Tobacco Use: Former    Passive Exposure: Not on [place] Strain: Not on file  Food Insecurity: Not on file  Transportation Needs: Not on  file  Physical Activity: Not on file  Stress: Not on file  Social Connections: Not on file  Intimate Partner Violence: Not on file  Depression (EYV7-0): Not on file  Alcohol Screen: Not on file  Housing: Not on file  Utilities: Not on file  Health Literacy: Not on file     Family History:   The patient's family history includes Breast cancer in his maternal aunt; Diabetes in his father; Heart disease in his mother; Pancreatic cancer in his father. There is no history of Colon cancer or Esophageal cancer.    ROS:  Please see the history of present illness.  All other ROS reviewed and negative.     Physical Exam/Data: Vitals:   01/10/25 1400  Weight: 81.6 kg   No intake or output data in the 24  hours ending 01/10/25 1443    01/10/2025    2:00 PM 05/05/2021   10:31 AM 04/16/2021    3:41 PM  Last 3 Weights  Weight (lbs) 180 lb 205 lb 206 lb 6.4 oz  Weight (kg) 81.647 kg 92.987 kg 93.622 kg     Body mass index is 24.41 kg/m.  General:  Well nourished, well developed, in no acute distress HEENT: normal Neck: no JVD Vascular: No carotid bruits; Distal pulses 2+ bilaterally   Cardiac:  normal S1, S2; RRR; no murmur  Lungs:  clear to auscultation bilaterally, no wheezing, rhonchi or rales  Abd: soft, nontender, no hepatomegaly  Ext: no edema Musculoskeletal:  No deformities, BUE and BLE strength normal and equal Skin: warm and dry  Neuro:   no focal abnormalities noted Psych:  Normal affect   EKG:  @ 1333 initially showed sinus rhythm with j point elevation repeat @1404  showing ST elevation in anterolateral leads  Relevant CV Studies:  N/a   Laboratory Data: High Sensitivity Troponin:  No results for input(s): TROPONINIHS in the last 720 hours. No results for input(s): TRNPT in the last 720 hours.      ChemistryNo results for input(s): NA, K, CL, CO2, GLUCOSE, BUN, CREATININE, CALCIUM , MG, GFRNONAA, GFRAA, ANIONGAP in the last 168 hours.  No results for input(s): PROT, ALBUMIN, AST, ALT, ALKPHOS, BILITOT in the last 168 hours. Lipids No results for input(s): CHOL, TRIG, HDL, LABVLDL, LDLCALC, CHOLHDL in the last 168 hours. HematologyNo results for input(s): WBC, RBC, HGB, HCT, MCV, MCH, MCHC, RDW, PLT in the last 168 hours. Thyroid  No results for input(s): TSH, FREET4 in the last 168 hours. BNPNo results for input(s): BNP, PROBNP in the last 168 hours.  DDimer No results for input(s): DDIMER in the last 168 hours.  Radiology/Studies:  No results found.   Assessment and Plan:  Aaron [name] is a 59 y.o. male with PMH of hemorrhoid and recent tooth infection who is being seen 01/10/2025 for  the evaluation of STEMI.  Anterior STEMI -- developed left sided chest pain with radiation down in the left arm around 1pm. On EMS arrival initial EKG with j point elevation but repeat with anterolateral ST elevation with CODE STEMI called in the field. Given 324mg  en route  -- brought directly to the cath lab for emergent cardiac cath. Further recommendations pending   Risk Assessment/Risk Scores:  TIMI Risk Score for ST  Elevation MI:   The patient's TIMI risk score is 1, which indicates a 1.6% risk of all cause mortality at 30 days.    Code Status: Full Code  Severity of Illness: The appropriate patient status for this patient is  INPATIENT. Inpatient status is judged to be reasonable and necessary in order to provide the required intensity of service to ensure the patient's safety. The patient's presenting symptoms, physical exam findings, and initial radiographic and laboratory data in the context of their chronic comorbidities is felt to place them at high risk for further clinical deterioration. Furthermore, it is not anticipated that the patient will be medically stable for discharge from the hospital within 2 midnights of admission.   * I certify that at the point of admission it is my clinical judgment that the patient will require inpatient hospital care spanning beyond 2 midnights from the point of admission due to high intensity of service, high risk for further deterioration and high frequency of surveillance required.*  For questions or updates, please contact  [facility] Please consult [email or web address] for contact info under       Signed, Aaron Rummer, NP  01/10/2025 2:43 PM   ATTENDING ATTESTATION:  After conducting a review of all available clinical information with the care team, interviewing the patient, and performing a physical exam, I agree with the findings and plan described in this note with adjustments as indicated below which were discussed and  enacted by staff above.   GEN: No acute distress, AO x 3 HEENT:  MMM, no JVD, no scleral icterus Cardiac: RRR, no murmurs, rubs, or gallops.  Respiratory: Clear to auscultation bilaterally. GI: Soft, nontender, non-distended  MS: No edema; No deformity.  Feels warm and well-perfused Neuro:  Nonfocal  Vasc:  +2 radial pulses  Patient is a 59 year old male with a history of smoking who developed acute onset chest pain around 1:00 today.  An EKG demonstrated an acute anterior ST elevation myocardial infarction.  He seems warm and well-perfused.  He is continue to have low-level chest pain.  Will refer for coronary angiography and possible percutaneous coronary invention.  Further recommendations will be informed by the results of this procedure.  Aaron Retana, MD Pager [phone]      [1] No Known Allergies  "

## 2025-01-10 NOTE — Progress Notes (Signed)
 After TR Band was applied, Patient noted to have swelling around right forearm. Patient denies any pain when palpating or applying pressure on forearm. Patient remains hemodynamically stable.  Dr. Wendel was made aware and advised to continuously monitor patient if there is any changes. Care was handed over to bedside nurse and was made aware of forearm.

## 2025-01-10 NOTE — Telephone Encounter (Signed)
 Pharmacy Patient Advocate Encounter  Insurance verification completed.    The patient is insured through [facility] MEDICAID.     Ran test claim for Brand Brilinta  90mg  tablet and the current 30 day co-pay is $4.   This test claim was processed through [employer]- copay amounts may vary at other pharmacies due to [employer], or as the patient moves through the different stages of their insurance plan.

## 2025-01-10 NOTE — Progress Notes (Signed)
 1830: TR band currently with 1cc of air. Area under band soft to touch. Forearm still noted to have swelling, no changes from first assessment. Patient stated area tender to firm pressure but denies any changes to pain. States It's still the same.. Pt educated to immediately notify RN of any increase pain, pressure, numbness/tingling to fingers. Pt stated understanding. At this time has no further questions or concerns.

## 2025-01-11 ENCOUNTER — Inpatient Hospital Stay (HOSPITAL_COMMUNITY)

## 2025-01-11 ENCOUNTER — Other Ambulatory Visit (HOSPITAL_COMMUNITY): Payer: Self-pay

## 2025-01-11 ENCOUNTER — Encounter (HOSPITAL_COMMUNITY): Payer: Self-pay | Admitting: Internal Medicine

## 2025-01-11 ENCOUNTER — Other Ambulatory Visit: Payer: Self-pay

## 2025-01-11 DIAGNOSIS — I2102 ST elevation (STEMI) myocardial infarction involving left anterior descending coronary artery: Secondary | ICD-10-CM | POA: Diagnosis not present

## 2025-01-11 LAB — BASIC METABOLIC PANEL WITH GFR
Anion gap: 16 — ABNORMAL HIGH (ref 5–15)
BUN: 11 mg/dL (ref 6–20)
CO2: 21 mmol/L — ABNORMAL LOW (ref 22–32)
Calcium: 8.9 mg/dL (ref 8.9–10.3)
Chloride: 105 mmol/L (ref 98–111)
Creatinine, Ser: 0.82 mg/dL (ref 0.61–1.24)
GFR, Estimated: >60 mL/min
Glucose, Bld: 139 mg/dL — ABNORMAL HIGH (ref 70–99)
Potassium: 3.4 mmol/L — ABNORMAL LOW (ref 3.5–5.1)
Sodium: 142 mmol/L (ref 135–145)

## 2025-01-11 LAB — LACTIC ACID, PLASMA: Lactic Acid, Venous: 3.4 mmol/L (ref 0.5–1.9)

## 2025-01-11 LAB — POCT I-STAT, CHEM 8
BUN: 11 mg/dL (ref 6–20)
Calcium, Ion: 1.02 mmol/L — ABNORMAL LOW (ref 1.15–1.40)
Chloride: 108 mmol/L (ref 98–111)
Creatinine, Ser: 0.9 mg/dL (ref 0.61–1.24)
Glucose, Bld: 132 mg/dL — ABNORMAL HIGH (ref 70–99)
HCT: 41 % (ref 39.0–52.0)
Hemoglobin: 13.9 g/dL (ref 13.0–17.0)
Potassium: 2.7 mmol/L — CL (ref 3.5–5.1)
Sodium: 147 mmol/L — ABNORMAL HIGH (ref 135–145)
TCO2: 21 mmol/L — ABNORMAL LOW (ref 22–32)

## 2025-01-11 LAB — CBC
HCT: 42.5 % (ref 39.0–52.0)
HCT: 38.5 % — ABNORMAL LOW (ref 39.0–52.0)
Hemoglobin: 15.2 g/dL (ref 13.0–17.0)
Hemoglobin: 13.6 g/dL (ref 13.0–17.0)
MCH: 33.4 pg (ref 26.0–34.0)
MCH: 33.8 pg (ref 26.0–34.0)
MCHC: 35.8 g/dL (ref 30.0–36.0)
MCHC: 35.3 g/dL (ref 30.0–36.0)
MCV: 93.4 fL (ref 80.0–100.0)
MCV: 95.8 fL (ref 80.0–100.0)
Platelets: 263 K/uL (ref 150–400)
Platelets: 259 K/uL (ref 150–400)
RBC: 4.55 MIL/uL (ref 4.22–5.81)
RBC: 4.02 MIL/uL — ABNORMAL LOW (ref 4.22–5.81)
RDW: 11.3 % — ABNORMAL LOW (ref 11.5–15.5)
RDW: 11.4 % — ABNORMAL LOW (ref 11.5–15.5)
WBC: 16.5 K/uL — ABNORMAL HIGH (ref 4.0–10.5)
WBC: 18.2 K/uL — ABNORMAL HIGH (ref 4.0–10.5)
nRBC: 0 % (ref 0.0–0.2)
nRBC: 0 % (ref 0.0–0.2)

## 2025-01-11 LAB — CG4 I-STAT (LACTIC ACID)
Lactic Acid, Venous: 1.6 mmol/L (ref 0.5–1.9)
Lactic Acid, Venous: 1.6 mmol/L (ref 0.5–1.9)
Lactic Acid, Venous: 2.2 mmol/L (ref 0.5–1.9)
Lactic Acid, Venous: 1.8 mmol/L (ref 0.5–1.9)

## 2025-01-11 LAB — TROPONIN T, HIGH SENSITIVITY: Troponin T High Sensitivity: 9434 ng/L (ref 0–19)

## 2025-01-11 LAB — ECHOCARDIOGRAM COMPLETE
Area-P 1/2: 5.13 cm2
Calc EF: 55.4 %
S' Lateral: 2.8 cm
Single Plane A2C EF: 46.9 %
Single Plane A4C EF: 60.7 %
Weight: 2880 [oz_av]

## 2025-01-11 LAB — POCT ACTIVATED CLOTTING TIME
Activated Clotting Time: 215 s
Activated Clotting Time: 225 s
Activated Clotting Time: 250 s
Activated Clotting Time: 194 s
Activated Clotting Time: 179 s

## 2025-01-11 MED ORDER — POLYETHYLENE GLYCOL 3350 17 G PO PACK
17.0000 g | PACK | Freq: Every day | ORAL | Status: DC
  Administered 2025-01-11 – 2025-01-25 (×14): 17 g via ORAL
  Filled 2025-01-11 (×15): qty 1

## 2025-01-11 MED ORDER — FENTANYL CITRATE (PF) 50 MCG/ML IJ SOSY
PREFILLED_SYRINGE | INTRAMUSCULAR | Status: AC
  Filled 2025-01-11: qty 1

## 2025-01-11 MED ORDER — ORAL CARE MOUTH RINSE: 15.0000 mL | OROMUCOSAL | Status: DC | PRN

## 2025-01-11 MED ORDER — POTASSIUM CHLORIDE CRYS ER 20 MEQ PO TBCR
40.0000 meq | EXTENDED_RELEASE_TABLET | Freq: Once | ORAL | Status: AC
  Administered 2025-01-11: 40 meq via ORAL
  Filled 2025-01-11 (×2): qty 2

## 2025-01-11 MED ORDER — LABETALOL HCL 5 MG/ML IV SOLN
10.0000 mg | INTRAVENOUS | Status: AC | PRN
  Administered 2025-01-11 (×2): 10 mg via INTRAVENOUS

## 2025-01-11 MED ORDER — PERFLUTREN LIPID MICROSPHERE
1.0000 mL | INTRAVENOUS | Status: AC | PRN
  Administered 2025-01-11: 4 mL via INTRAVENOUS

## 2025-01-11 MED ORDER — FENTANYL CITRATE (PF) 50 MCG/ML IJ SOSY
12.5000 ug | PREFILLED_SYRINGE | Freq: Once | INTRAMUSCULAR | Status: AC
  Administered 2025-01-11: 12.5 ug via INTRAVENOUS

## 2025-01-11 MED FILL — Nitroglycerin IV Soln 100 MCG/ML in D5W: INTRA_ARTERIAL | Qty: 10 | Status: AC

## 2025-01-11 MED FILL — Verapamil HCl IV Soln 2.5 MG/ML: INTRAVENOUS | Qty: 2 | Status: AC

## 2025-01-11 MED FILL — Heparin Sodium (Porcine) Inj 1000 Unit/ML: INTRAMUSCULAR | Qty: 10 | Status: AC

## 2025-01-11 NOTE — Progress Notes (Signed)
" °  Echocardiogram 2D Echocardiogram has been performed.  Koleen KANDICE Popper, RDCS 01/11/2025, 9:29 AM "

## 2025-01-11 NOTE — Progress Notes (Addendum)
 CARDIAC REHAB PHASE I   Post MI/stent education including restrictions, risk factors, exercise guidelines, antiplatelet therapy importance, MI booklet, NTG use, heart healthy diet, smoking cessation and CRP2 reviewed. Primary RN to ambulate patient at a later time d/t hematoma in groin area. All questions and concerns addressed. Will refer to [facility] for CRP2.    [id number] Isaiah JAYSON [place], RN BSN 01/11/2025 10:13 AM

## 2025-01-11 NOTE — TOC CM/SW Note (Signed)
 Transition of Care [facility]) - Inpatient Brief Assessment   Patient Details  Name: Aaron [name] MRN: [id number] Date of Birth: 26-Jul-1966  Transition of Care [facility]) CM/SW Contact:    Sudie Erminio Deems, RN Phone Number: 01/11/2025, 6:20 PM   Clinical Narrative: Patient presented for chest pain-Stemi. Benefits check completed for Brilinta  $4.00. PTA patient reports he is from home alone. Patient does not use any DME and he drives to appointments. Patient states PCP is Dr. Michaela. Patient uses [facility] [address] for medications. No further home needs identified. ICM will continue to follow for additional disposition needs.    Transition of Care Asessment: Insurance and Status: Insurance coverage has been reviewed Patient has primary care physician: Yes (Dr. Michaela) Home environment has been reviewed: reviewed Prior level of function:: independent Prior/Current Home Services: No current home services Social Drivers of Health Review: SDOH reviewed no interventions necessary Readmission risk has been reviewed: Yes Transition of care needs: no transition of care needs at this time

## 2025-01-11 NOTE — Progress Notes (Addendum)
 Arterial sheath removal started at 0300. Pressure held for 30 min. Small hematoma developed and resolved with pressure during that 30 min. period. Pt experienced nausea and vomiting at 0343 and additional hematoma developed. Additional pressure held for 15 min  until hematoma resolved. Dorn Flemings, MD notified.   9475: perineal edema developed; enlarged x3 from normal size; Dr. Flemings paged   [phone]: Dr. Flemings at the bedside holding pressure to R femoral site

## 2025-01-11 NOTE — Progress Notes (Addendum)
"  °  Progress Note  Patient Name: Aaron [name] Date of Encounter: 01/11/2025 [facility] Cardiologist: Lurena MARLA Red, MD   Interval Summary   S/p PCI of mid LAD 2/2 STEMI via femoral approach   Overnight developed R femoral hematoma with scrotal swelling  No complaints this AM  Vital Signs Vitals:   01/11/25 0630 01/11/25 0645 01/11/25 0700 01/11/25 0715  BP: 136/81 132/81 (!) 150/139 (!) 140/89  Pulse: 75 66 84 90  Resp: 15 15 (!) 24 19  Temp:      TempSrc:      SpO2: 97% 97% 95% 98%  Weight:        Intake/Output Summary (Last 24 hours) at 01/11/2025 0745 Last data filed at 01/11/2025 0600 Gross per 24 hour  Intake 193.49 ml  Output 1025 ml  Net -831.51 ml      01/10/2025    2:00 PM 05/05/2021   10:31 AM 04/16/2021    3:41 PM  Last 3 Weights  Weight (lbs) 180 lb 205 lb 206 lb 6.4 oz  Weight (kg) 81.647 kg 92.987 kg 93.622 kg      Telemetry/ECG  SR NSVT - Personally Reviewed  Physical Exam  GEN: No acute distress.   Neck: No JVD Cardiac: RRR, no murmurs, rubs, or gallops.  Respiratory: Clear to auscultation bilaterally. GI: Soft, nontender, non-distended  MS: No edema; mild hematoma R groin, + scrotal edema; R radial CDI  Assessment & Plan  ACS/STEMI S/p PCI mLAD DES x 1 1/21 Ticagrelor  90 bid ASA 81 DAPT x 1 year Toprol  12.5mg  Atorvastatin  80 TTE today NSVT Replete K (discussed with pharmacy) Toprol  12.5mg  HL Atorvastatin  80 R groin hematoma Looks stable  Given scrotal swelling, will obtain CT scan Disposition Up to chair, ambulate D/C likely tomorrow If CT ok, transfer out of 2H    For questions or updates, please contact [facility] Please consult [email or web address] for contact info under         Signed, Deriona Altemose K Mikiala Fugett, MD  Patient ID: Ishaaq Penna, male   DOB: Sep 28, 1966, 59 y.o.   MRN: [id number]  "

## 2025-01-12 LAB — BASIC METABOLIC PANEL WITH GFR
Anion gap: 10 (ref 5–15)
BUN: 10 mg/dL (ref 6–20)
CO2: 24 mmol/L (ref 22–32)
Calcium: 8.7 mg/dL — ABNORMAL LOW (ref 8.9–10.3)
Chloride: 109 mmol/L (ref 98–111)
Creatinine, Ser: 0.75 mg/dL (ref 0.61–1.24)
GFR, Estimated: >60 mL/min
Glucose, Bld: 127 mg/dL — ABNORMAL HIGH (ref 70–99)
Potassium: 3.7 mmol/L (ref 3.5–5.1)
Sodium: 142 mmol/L (ref 135–145)

## 2025-01-12 LAB — LIPOPROTEIN A (LPA): Lipoprotein (a): 121.2 nmol/L — ABNORMAL HIGH

## 2025-01-12 MED ORDER — POTASSIUM CHLORIDE CRYS ER 20 MEQ PO TBCR
40.0000 meq | EXTENDED_RELEASE_TABLET | Freq: Once | ORAL | Status: AC
  Administered 2025-01-12: 40 meq via ORAL
  Filled 2025-01-12: qty 2

## 2025-01-12 MED ORDER — SENNOSIDES-DOCUSATE SODIUM 8.6-50 MG PO TABS
1.0000 | ORAL_TABLET | Freq: Every day | ORAL | Status: DC
  Administered 2025-01-12 – 2025-01-24 (×12): 1 via ORAL
  Filled 2025-01-12 (×12): qty 1

## 2025-01-12 MED ORDER — ACETAMINOPHEN-CODEINE 300-30 MG PO TABS
1.0000 | ORAL_TABLET | Freq: Once | ORAL | Status: AC
  Administered 2025-01-12: 1 via ORAL
  Filled 2025-01-12: qty 1

## 2025-01-12 MED ORDER — LOSARTAN POTASSIUM 25 MG PO TABS
25.0000 mg | ORAL_TABLET | Freq: Every day | ORAL | Status: DC
  Administered 2025-01-12 – 2025-01-25 (×14): 25 mg via ORAL
  Filled 2025-01-12 (×14): qty 1

## 2025-01-12 MED ORDER — MORPHINE SULFATE (PF) 2 MG/ML IV SOLN
1.0000 mg | INTRAVENOUS | Status: DC | PRN
  Administered 2025-01-12 (×2): 1 mg via INTRAVENOUS
  Filled 2025-01-12 (×2): qty 1

## 2025-01-12 MED ORDER — HYDROCODONE-ACETAMINOPHEN 5-325 MG PO TABS
1.0000 | ORAL_TABLET | Freq: Four times a day (QID) | ORAL | Status: DC | PRN
  Administered 2025-01-12 – 2025-01-17 (×11): 1 via ORAL
  Filled 2025-01-12 (×13): qty 1

## 2025-01-12 MED ORDER — ACETAMINOPHEN 500 MG PO TABS
500.0000 mg | ORAL_TABLET | Freq: Four times a day (QID) | ORAL | Status: DC
  Administered 2025-01-12 – 2025-01-17 (×12): 500 mg via ORAL
  Filled 2025-01-12 (×15): qty 1

## 2025-01-12 NOTE — Progress Notes (Addendum)
 "  Progress Note  Patient Name: Aaron [name] Date of Encounter: 01/12/2025 [facility] Cardiologist: Isiac Breighner K Tharon Kitch, MD   Interval Summary    No chest pain, still having quite a bit of pain from scrotal swelling. Ambulated in 2H prior to transfer   Vital Signs Vitals:   01/11/25 2137 01/12/25 0028 01/12/25 0300 01/12/25 0752  BP: 128/87 104/65 94/67 119/76  Pulse: 98 94    Resp:  18 18 17   Temp:  98.6 F (37 C) 98.6 F (37 C) 98.7 F (37.1 C)  TempSrc:  Oral Oral Axillary  SpO2:  100%    Weight:      Height:        Intake/Output Summary (Last 24 hours) at 01/12/2025 0829 Last data filed at 01/12/2025 0600 Gross per 24 hour  Intake 240 ml  Output 1320 ml  Net -1080 ml      01/11/2025    8:35 PM 01/10/2025    2:00 PM 05/05/2021   10:31 AM  Last 3 Weights  Weight (lbs) 180 lb 180 lb 205 lb  Weight (kg) 81.647 kg 81.647 kg 92.987 kg      Telemetry/ECG   Sinus Rhythm - Personally Reviewed  Physical Exam  GEN: No acute distress.   Neck: No JVD Cardiac: RRR, no murmurs, rubs, or gallops.  Respiratory: Clear to auscultation bilaterally. GI: Soft, nontender, non-distended  MS: No edema VAS: right radial cath site stable. Right femoral site with bruising, mild hematoma with scrotal edema   Assessment & Plan   59 y.o. male with PMH of hemorrhoid and recent tooth infection who is being seen 01/10/2025 for the evaluation of STEMI.   Anterior STEMI -- Underwent cardiac catheterization 1/21 with 100% thrombotic occluded mid LAD treated with PCI/DES x 1.  Recommendations for DAPT with aspirin /Brilinta  for at least 1 year.  No recurrent chest pain.  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  -- Site remains stable, though continues to have pain/soreness   Right renal nodule -- noted on CT, likely benign. Follow up with PCP     For questions or updates, please contact  [facility] Please consult [email or web address] for contact info under         Signed, Manuelita Rummer, NP   ATTENDING ATTESTATION:  After conducting a review of all available clinical information with the care team, interviewing the patient, and performing a physical exam, I agree with the findings and plan described in this note with adjustments as indicated below which were discussed and enacted by staff above.   GEN: No acute distress, AO x 3 HEENT:  MMM, no JVD, no scleral icterus Cardiac: RRR, no murmurs, rubs, or gallops.  Respiratory: Clear to auscultation bilaterally. GI: Soft, nontender, non-distended MS: No edema; No deformity.  R groin hematoma + scrotal edema Neuro:  Nonfocal  Vasc:  +2 radial pulses  No acute events.  Scrotal edema somewhat improved.  Groin remains uncomfortable.  Start morphine  1mg  Q3hr PRN  TTE with EF 45-50%.  Start losartan  12mg .  Continue Toprol  12.5mg .  Continue DAPT x 1 year.  Continue atorvastatin  80mg .  Given ongoing R groin pain/scrotal swelling, will keep in house for pain control.  Raife Lizer, MD Pager [phone]   "

## 2025-01-12 NOTE — Plan of Care (Signed)

## 2025-01-12 NOTE — Progress Notes (Signed)
 Mobility Specialist Progress Note;    01/12/25 1448  Mobility  Activity Ambulated with assistance  Level of Assistance Contact guard assist, steadying assist  Assistive Device Front wheel walker  Distance Ambulated (ft) 150 ft  Activity Response Tolerated well  Mobility Referral Yes  Mobility visit 1 Mobility  Mobility Specialist Start Time (ACUTE ONLY) 1448  Mobility Specialist Stop Time (ACUTE ONLY) 1517  Mobility Specialist Time Calculation (min) (ACUTE ONLY) 29 min   Pt received in bed agreeable to mobility. MaxA getting to EoB d/t scrotal pain. No physical assistance required from there, CGA for safety. Upon ambulation pt HR reached up to 132 however no complaints. Pt left in bed with call bell and personal belongings in reach. Bed alarm on. All needs met.   Ricky Janeal MATSU, BS Mobility Specialist Please contact via Special Educational Needs Teacher or [employer] [phone]

## 2025-01-13 LAB — BASIC METABOLIC PANEL WITH GFR
Anion gap: 10 (ref 5–15)
BUN: 12 mg/dL (ref 6–20)
CO2: 23 mmol/L (ref 22–32)
Calcium: 8.9 mg/dL (ref 8.9–10.3)
Chloride: 110 mmol/L (ref 98–111)
Creatinine, Ser: 0.81 mg/dL (ref 0.61–1.24)
GFR, Estimated: >60 mL/min
Glucose, Bld: 111 mg/dL — ABNORMAL HIGH (ref 70–99)
Potassium: 4 mmol/L (ref 3.5–5.1)
Sodium: 142 mmol/L (ref 135–145)

## 2025-01-13 MED ORDER — BISACODYL 10 MG RE SUPP: 10.0000 mg | Freq: Every day | RECTAL | Status: DC | PRN

## 2025-01-13 MED ORDER — METOPROLOL SUCCINATE ER 25 MG PO TB24
25.0000 mg | ORAL_TABLET | Freq: Every day | ORAL | Status: DC
  Administered 2025-01-13 – 2025-01-16 (×4): 25 mg via ORAL
  Filled 2025-01-13 (×4): qty 1

## 2025-01-13 NOTE — Discharge Instructions (Signed)
 Heart-Healthy Nutrition Therapy  A heart-healthy diet is recommended to reduce your unhealthy blood cholesterol levels, manage high blood pressure, and lower your risk for heart disease. To follow a heart-healthy diet, Eat a balanced diet with whole grains, fruits and vegetables, and lean protein sources. Achieve and maintain a healthy weight. Choose heart-healthy unsaturated fats. Limit saturated fats, trans fats, and cholesterol intake. Eat more plant-based or vegetarian meals using beans and soy foods for protein. Eat whole, unprocessed foods to limit the amount of sodium (salt) you eat. Limit refined carbohydrates especially sugar, sweets and sugar-sweetened beverages. If you drink alcohol, do so in moderation: one serving per day (women) and two servings per day (men). One serving is equivalent to 12 ounces beer, 5 ounces wine, or 1.5 ounces distilled spirits  Tips Tips for Choosing Heart-Healthy Fats Choose lean protein and low-fat dairy foods to reduce saturated fat intake. Saturated fat is usually found in animal-based protein and is associated with certain health risks. Saturated fat is the biggest contributor to raised low-density lipoprotein (LDL) cholesterol levels in the diet. Research shows that limiting saturated fat lowers unhealthy cholesterol levels. Eat no more than 5-6% of your total calories each day from saturated fat. Ask your registered dietitian nutritionist (RDN) to help you determine how much saturated fat is right for you. There are many foods that do not contain large amounts of saturated fats. Swapping these foods to replace foods high in saturated fats will help you limit the saturated fat you eat and improve your cholesterol levels. You can also try eating more plant-based or vegetarian meals. Instead of Try:  Whole milk, cheese, yogurt, and ice cream 1%, %, or skim milk, low-fat cheese, non-fat yogurt, and low-fat ice cream  Fatty, marbled beef and pork Lean  beef, pork, or venison  Poultry with skin Poultry without skin  Butter, stick margarine Reduced-fat, whipped, or liquid spreads  Coconut oil, palm oil Liquid vegetable oils: corn, canola, olive, soybean and safflower oils   Avoid trans fats. Trans fats increase levels of LDL-cholesterol. Hydrogenated fat in processed foods is the main source of trans fats in foods.  Trans fats can be found in stick margarine, shortening, processed sweets, baked goods, some fried foods, and packaged foods made with hydrogenated oils. Avoid foods with partially hydrogenated oil on the ingredient list such as: cookies, pastries, baked goods, biscuits, crackers, microwave popcorn, and frozen dinners. Choose foods with heart healthy fats. Polyunsaturated and monounsaturated fat are unsaturated fats that may help lower your blood cholesterol level when used in place of saturated fat in your diet. Ask your RDN about taking a dietary supplement with plant sterols and stanols to help lower your cholesterol level. Research shows that substituting saturated fats with unsaturated fats is beneficial to cholesterol levels. Try these easy swaps:   Instead of Try:  Butter, stick margarine, or solid shortening Reduced-fat, whipped, or liquid spreads  Beef, pork, or poultry with skin   Fish and seafood  Chips, crackers, snack foods Raw or unsalted nuts and seeds or nut butters Hummus with vegetables Avocado on toast  Coconut oil, palm oil Liquid vegetable oils: corn, canola, olive, soybean and safflower oils    Limit the amount of cholesterol you eat to less than 200 milligrams per day. Cholesterol is a substance carried through the bloodstream via lipoproteins, which are known as transporters of fat. Some body functions need cholesterol to work properly, but too much cholesterol in the bloodstream can damage arteries and build up blood  vessel linings (which can lead to heart attack and stroke). You should eat less than  200 milligrams cholesterol per day. People respond differently to eating cholesterol. There is no test available right now that can figure out which people will respond more to dietary cholesterol and which will respond less. For individuals with high intake of dietary cholesterol, different types of increase (none, small, moderate, large) in LDL-cholesterol levels are all possible.   Food sources of cholesterol include egg yolks and organ meats such as liver, gizzards.  Limit egg yolks to two to four per week and avoid organ meats like liver and gizzards to control cholesterol intake.  Tips for Choosing Heart-Healthy Carbohydrates Consume foods rich in viscous (soluble) fiber Viscous, or soluble, fiber is found in the walls of plant cells. Viscous fiber is found only in plant-based foods--animal-based foods like meat or dairy products do not contain fiber. In the stomach, viscous fibers absorb water and swell to form a thick, jelly-like mass. This helps to lower your unhealthy cholesterol Rich sources of viscous fiber include asparagus, Brussels sprouts, sweet potatoes, turnips, apricots, mangoes, oranges, legumes, barley, oats, and oat bran. Eat at least 5 to 10 grams of viscous fiber each day. As you increase your fiber intake gradually, also increase the amount of water you drink. This will help prevent constipation. If you have difficulty achieving this goal, ask your RDN about fiber laxatives. Choose fiber supplements made with viscous fibers such as psyllium seed husks or methylcellulose to help lower unhealthy cholesterol. Limit refined carbohydrates There are three types of carbohydrates: starches, sugar, and fiber. Some carbohydrates occur naturally in food, like the starches in rice or corn or the sugars in fruits and milk. Refined carbohydrates--foods with high amounts of simple sugars--can raise triglyceride levels. High triglyceride levels are associated with coronary heart disease. Some  examples of refined carbohydrate foods are table sugar, sweets, and beverages sweetened with added sugar. Tips for Reducing Sodium (Salt) Although sodium is important for your body to function, too much sodium can be harmful for people with high blood pressure.  As sodium and fluid buildup in your tissues and bloodstream, your blood pressure increases. High blood pressure may cause damage to other organs and increase your risk for a stroke. Keep your salt intake to 2300 milligrams or less per day. Even if you take a pill for blood pressure or a water pill (diuretic) to remove fluid, it is still important to have less salt in your diet. Ask your RDN what amount of sodium is right for you. Avoid processed foods.  Eat more fresh foods. Fresh fruits and vegetables are naturally low in sodium, as well as frozen vegetables and fruits that have no added juices or sauces. Fresh meats are lower in sodium than processed meats, such as bacon, sausage, and hotdogs.  Read the nutrition label or ask your butcher to help you find a fresh meat that is low in sodium. Eat less salt--at the table and when cooking. A single teaspoon of table salt has 2,300 mg of sodium. Leave the salt out of recipes for pasta, casseroles, and soups. Ask your RDN how to cook your favorite recipes without sodium Be a smart shopper. Look for food packages that say salt-free or sodium-free. These items contain less than 5 milligrams of sodium per serving. Very low-sodium products contain less than 35 milligrams of sodium per serving. Low-sodium products contain less than 140 milligrams of sodium per serving.  Beware of reduced salt or reduced  sodium products.  These items may still be high in sodium. Check the nutrition label.  Add flavors to your food without adding sodium. Try lemon juice, lime juice, fruit juice or vinegar.   Dry or fresh herbs add flavor. Try basil, bay leaf, dill, rosemary, parsley, sage, dry mustard,  nutmeg, thyme, and paprika. Pepper, red pepper flakes, and cayenne pepper can add spice to your meals without adding sodium. Hot sauce contains sodium, but if you use just a drop or two, it will not add up to much. Buy a sodium-free seasoning blend or make your own at home.    Additional Lifestyle Tips Achieve and maintain a healthy weight. Talk with your RDN or your doctor about what is a healthy weight for you. Set goals to reach and maintain that weight.  To lose weight, reduce your calorie intake along with increasing your physical activity. A weight loss of 10 to 15 pounds could reduce LDL-cholesterol by 5 milligrams per deciliter. Participate in physical activity. Talk with your health care team to find out what types of physical activity are best for you. Set a plan to get about 30 minutes of exercise on most days.   Foods to Choose or to Limit Food Group Foods to Choose Foods to Limit  Grains Whole grain breads and cereals, including whole wheat, barley, rye, buckwheat, corn, teff, quinoa, millet, amaranth, brown or wild rice, sorghum, and oats Pasta, especially whole wheat or other whole grain types [employer], quinoa or wild rice Whole grain crackers, bread, rolls, pitas Home-made bread with reduced-sodium baking soda Breads or crackers topped with salt Cereals (hot or cold) with more than 300 mg sodium per serving Biscuits, cornbread, and other quick breads prepared with baking soda Bread crumbs or stuffing mix from a store High-fat bakery products, such as doughnuts, biscuits, croissants, danish pastries, pies, cookies Instant cooking foods to which you add hot water and stir--potatoes, noodles, rice, etc. Packaged starchy foods--seasoned noodle or rice dishes, stuffing mix, macaroni and cheese dinner Snacks made with partially hydrogenated oils, including chips, cheese puffs, snack mixes, regular crackers, butter-flavored popcorn  Protein Foods Lean cuts of beef and pork  (loin, leg, round, extra lean hamburger) Skinless Press Photographer and other wild game Dried beans and peas Nuts and nut butters (unsalted) Seeds and seed butters (unsalted) Meat alternatives made with soy or textured vegetable protein Egg whites or egg substitute Cold cuts made with lean meat or soy protein Higher-fat cuts of meats (ribs, t-bone steak, regular hamburger) Bacon, sausage, or hot dogs Cold cuts, such as salami or bologna, deli meats, cured meats, corned beef Organ meats (liver, brains, gizzards, sweetbreads) Poultry with skin Fried or smoked meat, poultry, and fish Whole eggs and egg yolks (more than 2-4 per week) Salted legumes, nuts, seeds, or nut/seed butters Meat alternatives with high levels of sodium (>300 mg per serving) or saturated fat (>5 g per serving)  Civil Engineer, Contracting Alternatives Nonfat (skim), low-fat, or 1%-fat milk Nonfat or low-fat yogurt or cottage cheese Fat-free and low-fat cheese Fortified non-dairy milk: almond, cashew, pea, and soy Whole milk, 2% fat milk, buttermilk Whole milk yogurt or ice cream Cream, half-&-half Cream cheese Sour cream Cheese  Vegetables Fresh, frozen, or canned vegetables without added fat or salt Avocados Canned or frozen vegetables with salt, fresh vegetables prepared with salt, butter, cheese, or cream sauce Fried vegetables Pickled vegetables such as olives, pickles, or sauerkraut  Fruits Fresh, frozen, canned, or dried fruit Fried fruits Fruits  served with butter or cream  Oils Unsaturated oils (corn, olive, peanut, soy, sunflower, canola) Soft or liquid margarines and vegetable oil spreads Salad dressings made from saturated fats Butter, stick margarine, shortening Partially hydrogenated oils or trans fats Tropical oils (coconut, palm, palm kernel oils)  Other Prepared or homemade foods, including soups, casseroles, and salads made from recommended ingredients and contain <600 mg sodium. Low-sodium seasonings  (ketchup, barbeque sauce) Spices, herbs, Salt-free seasoning mixes and marinades Vinegar Lemon or lime juice Prepared foods, including soups, casseroles, and salads made from recommended ingredients and contain >600 mg sodium. Frozen meals and prepared sides that are >600 mg of sodium Sugary and/or fatty desserts, candy, and other sweets Salts:  sea salt, kosher salt, onion salt, and garlic salt, seasoning mixes containing salt Flavorings: bouillon cubes, catsup or ketchup, barbeque sauce, Worcestershire sauce, soy sauce, salsa, relish, teriyaki sauce   Heart-Healthy Sample 1-Day Menu View Nutrient Info Breakfast 1 cup oatmeal 1 cup fat-free milk 1 cup blueberries 1 ounce almonds, unsalted 1 cup brewed coffee  Lunch 2 slices whole-wheat bread 2 ounces lean turkey breast 1 ounce low-fat Swiss cheese 2 slices tomato 2 lettuce leaves 1 pear 1 cup skim milk  Afternoon Snack 1 ounce trail mix with unsalted nuts, seeds, and raisins  Evening Meal 3 ounces broiled salmon 2/3 cup brown rice 1 teaspoon margarine, soft tub  cup broccoli, cooked  cup carrots, cooked 1 cup tossed salad 1 teaspoon olive oil and vinegar dressing 1 small whole-wheat roll 1 teaspoon margarine, soft tub 1 cup tea  Evening Snack 1 small banana  Daily Sum Nutrient Unit Value  Macronutrients  Energy kcal 2069  Energy kJ 8655  Protein g 146  Total lipid (fat) g 69  Carbohydrate, by difference g 228  Fiber, total dietary g 33  Sugars, total g 85  Minerals  Calcium , Ca mg 1292  Iron, Fe mg 14  Sodium, Na mg 1751  Vitamins  Vitamin C, total ascorbic acid mg 85  Vitamin A, IU IU 17756  Vitamin D IU 231  Lipids  Fatty acids, total saturated g 12  Fatty acids, total monounsaturated g 27  Fatty acids, total polyunsaturated g 24  Cholesterol mg 270     Heart-Healthy Vegan 1-Day Sample Menu View Nutrient Info Breakfast 1 slice whole wheat toast 2 tablespoons peanut butter without salt Tofu scramble  made with:  cup calcium -set tofu  cup green pepper  cup spinach  cup tomatoes  cup white mushrooms 1 teaspoon canola oil 1 cup soymilk fortified with calcium , vitamin B12, and vitamin D  Lunch 1 cup reduced sodium split pea soup 1 whole wheat dinner roll 1 medium apple  Dinner Salad made with: 1 cup lentils  cup cooked broccoli  cup cooked carrots 2 tablespoons hummus 1 cup sliced strawberries 1 cup soymilk fortified with calcium , vitamin B12, and vitamin D  Evening Snack 1 cup soy yogurt  cup mixed nuts  Daily Sum Nutrient Unit Value  Macronutrients  Energy kcal 1672  Energy kJ 7000  Protein g 86  Total lipid (fat) g 65  Carbohydrate, by difference g 205  Fiber, total dietary g 47  Sugars, total g 73  Minerals  Calcium , Ca mg 1443  Iron, Fe mg 19  Sodium, Na mg 1148  Vitamins  Vitamin C, total ascorbic acid mg 253  Vitamin A, IU IU 15451  Vitamin D IU 361  Lipids  Fatty acids, total saturated g 11  Fatty acids, total monounsaturated g  29  Fatty acids, total polyunsaturated g 19  Cholesterol mg 5     Heart-Healthy Vegetarian (Lacto-Ovo) Sample 1-Day Menu View Nutrient Info Breakfast 1 cup cooked oatmeal 1 tablespoon ground flaxseed 1 cup blueberries 2 scrambled egg whites with 1 teaspoon canola oil 2 tablespoons salsa 1 cup fat-free milk 1 cup coffee Oil, canola  Lunch 2 slices whole wheat bread 2 tablespoons peanut butter without salt 1 small banana 6 ounces fat-free plain yogurt 1 cup sliced red pepper 2 tablespoons hummus 1 cup fat-free milk  Evening Meal Stir fry made with:  cup tofu 1 cup brown rice  cup cooked broccoli  cup cooked carrots  cup cooked green beans 1 teaspoon peanut oil  Evening Snack 1 slice low-fat mozzarella cheese 1 medium apple  Daily Sum Nutrient Unit Value  Macronutrients  Energy kcal 1764  Energy kJ 7375  Protein g 87  Total lipid (fat) g 53  Carbohydrate, by difference g 254  Fiber, total dietary g 35   Sugars, total g 98  Minerals  Calcium , Ca mg 1609  Iron, Fe mg 12  Sodium, Na mg 1434  Vitamins  Vitamin C, total ascorbic acid mg 210  Vitamin A, IU IU 16317  Vitamin D IU 234  Lipids  Fatty acids, total saturated g 12  Fatty acids, total monounsaturated g 21  Fatty acids, total polyunsaturated g 15  Cholesterol mg 31    Copyright 2020  [employer]. All rights reserved    Information about your medication: Brilinta  (anti-platelet agent)  Generic Name (Brand): ticagrelor  (Brilinta ), twice daily medication  PURPOSE: You are taking this medication along with aspirin  to lower your chance of having a heart attack, stroke, or blood clots in your heart stent. These can be fatal. Brilinta  and aspirin  help prevent platelets from sticking together and forming a clot that can block an artery or your stent.   Common SIDE EFFECTS you may experience include: bruising or bleeding more easily, shortness of breath  Do not stop taking BRILINTA  without talking to the doctor who prescribes it for you. People who are treated with a stent and stop taking Brilinta  too soon, have a higher risk of getting a blood clot in the stent, having a heart attack, or dying. If you stop Brilinta  because of bleeding, or for other reasons, your risk of a heart attack or stroke may increase.   Avoid taking NSAID agents or anti-inflammatory medications such as ibuprofen, naproxen given increased bleed risk with Brilinta  - can use acetaminophen  (Tylenol ) if needed for pain.  Tell all of your doctors and dentists that you are taking Brilinta . They should talk to the doctor who prescribed Brilinta  for you before you have any surgery or invasive procedure.   Contact your health care provider if you experience: severe or uncontrollable bleeding, pink/red/brown urine, vomiting blood or vomit that looks like coffee grounds, red or black stools (looks like tar), coughing up blood or blood  clots ----------------------------------------------------------------------------------------------------------------------  Low Sodium Nutrition Therapy  Eating less sodium can help you if you have high blood pressure, heart failure, or kidney or liver disease.   Your body needs a little sodium, but too much sodium can cause your body to hold onto extra water. This extra water will raise your blood pressure and can cause damage to your heart, kidneys, or liver as they are forced to work harder.   Sometimes you can see how the extra fluid affects you because your hands, legs, or  belly swell. You may also hold water around your heart and lungs, which makes it hard to breathe.   Even if you take medication for blood pressure or a water pill (diuretic) to remove fluid, it is still important to have less salt in your diet.   Check with your primary care provider before drinking alcohol since it may affect the amount of fluid in your body and how your heart, kidneys, or liver work. Sodium in Food A low-sodium meal plan limits the sodium that you get from food and beverages to 1,500-2,000 milligrams (mg) per day. Salt is the main source of sodium. Read the nutrition label on the package to find out how much sodium is in one serving of a food.  Select foods with 140 milligrams (mg) of sodium or less per serving.  You may be able to eat one or two servings of foods with a little more than 140 milligrams (mg) of sodium if you are closely watching how much sodium you eat in a day.  Check the serving size on the label. The amount of sodium listed on the label shows the amount in one serving of the food. So, if you eat more than one serving, you will get more sodium than the amount listed.  Tips Cutting Back on Sodium Eat more fresh foods.  Fresh fruits and vegetables are low in sodium, as well as frozen vegetables and fruits that have no added juices or sauces.  Fresh meats are lower in sodium than  processed meats, such as bacon, sausage, and hotdogs.  Not all processed foods are unhealthy, but some processed foods may have too much sodium.  Eat less salt at the table and when cooking. One of the ingredients in salt is sodium.  One teaspoon of table salt has 2,300 milligrams of sodium.  Leave the salt out of recipes for pasta, casseroles, and soups. Be a engineer, building services.  Food packages that say Salt-free, sodium-free, very low sodium, and low sodium have less than 140 milligrams of sodium per serving.  Beware of products identified as Unsalted, No Salt Added, Reduced Sodium, or Lower Sodium. These items may still be high in sodium. You should always check the nutrition label. Add flavors to your food without adding sodium.  Try lemon juice, lime juice, or vinegar.  Dry or fresh herbs add flavor.  Buy a sodium-free seasoning blend or make your own at home. You can purchase salt-free or sodium-free condiments like barbeque sauce in stores and online. Ask your registered dietitian nutritionist for recommendations and where to find them.   Eating in Restaurants Choose foods carefully when you eat outside your home. Restaurant foods can be very high in sodium. Many restaurants provide nutrition facts on their menus or their websites. If you cannot find that information, ask your server. Let your server know that you want your food to be cooked without salt and that you would like your salad dressing and sauces to be served on the side.    Foods Recommended Food Group Foods Recommended  Grains Bread, bagels, rolls without salted tops Homemade bread made with reduced-sodium baking powder Cold cereals, especially shredded wheat and puffed rice Oats, grits, or cream of wheat Pastas, quinoa, and rice Popcorn, pretzels or crackers without salt Corn tortillas  Protein Foods Fresh meats and fish; turkey bacon (check the nutrition labels - make sure they are not packaged in a sodium  solution) Canned or packed tuna (no more than 4 ounces at 1  serving) Beans and peas Soybeans) and tofu Eggs Nuts or nut butters without salt  Dairy Milk or milk powder Plant milks, such as rice and soy Yogurt, including Greek yogurt Small amounts of natural cheese (blocks of cheese) or reduced-sodium cheese can be used in moderation. (Swiss, ricotta, and fresh mozzarella cheese are lower in sodium than the others) Cream Cheese Low sodium cottage cheese  Vegetables Fresh and frozen vegetables without added sauces or salt Homemade soups (without salt) Low-sodium, salt-free or sodium-free canned vegetables and soups  Fruit Fresh and canned fruits Dried fruits, such as raisins, cranberries, and prunes  Oils Tub or liquid margarine, regular or without salt Canola, corn, peanut, olive, safflower, or sunflower oils  Condiments Fresh or dried herbs such as basil, bay leaf, dill, mustard (dry), nutmeg, paprika, parsley, rosemary, sage, or thyme.  Low sodium ketchup Vinegar  Lemon or lime juice Pepper, red pepper flakes, and cayenne. Hot sauce contains sodium, but if you use just a drop or two, it will not add up to much.  Salt-free or sodium-free seasoning mixes and marinades Simple salad dressings: vinegar and oil   Foods Not Recommended Food Group Foods Not Recommended  Grains Breads or crackers topped with salt Cereals (hot/cold) with more than 300 mg sodium per serving Biscuits, cornbread, and other quick breads prepared with baking soda Pre-packaged bread crumbs Seasoned and packaged rice and pasta mixes Self-rising flours  Protein Foods Cured meats: Bacon, ham, sausage, pepperoni and hot dogs Canned meats (chili, vienna sausage, or sardines) Smoked fish and meats Frozen meals that have more than 600 mg of sodium per serving Egg substitute (with added sodium)  Dairy Buttermilk Processed cheese spreads Cottage cheese (1 cup may have over 500 mg of sodium; look for  low-sodium.) American or feta cheese Shredded Cheese has more sodium than blocks of cheese String cheese  Vegetables Canned vegetables (unless they are salt-free, sodium-free or low sodium) Frozen vegetables with seasoning and sauces Sauerkraut and pickled vegetables Canned or dried soups (unless they are salt-free, sodium-free, or low sodium) French fries and onion rings  Fruit Dried fruits preserved with additives that have sodium  Oils Salted butter or margarine, all types of olives  Condiments Salt, sea salt, kosher salt, onion salt, and garlic salt Seasoning mixes with salt Bouillon cubes Ketchup Barbeque sauce and Worcestershire sauce unless low sodium Soy sauce Salsa, pickles, olives, relish Salad dressings: ranch, blue cheese, Italian, and French.   Low Sodium Sample 1-Day Menu  Breakfast 1 cup cooked oatmeal  1 slice whole wheat bread toast  1 tablespoon peanut butter without salt  1 banana  1 cup 1% milk  Lunch Tacos made with: 2 corn tortillas   cup black beans, low sodium   cup roasted or grilled chicken (without skin)   avocado  Squeeze of lime juice  1 cup salad greens  1 tablespoon low-sodium salad dressing   cup strawberries  1 orange  Afternoon Snack 1/3 cup grapes  6 ounces yogurt  Evening Meal 3 ounces herb-baked fish  1 baked potato  2 teaspoons olive oil   cup cooked carrots  2 thick slices tomatoes on:  2 lettuce leaves  1 teaspoon olive oil  1 teaspoon balsamic vinegar  1 cup 1% milk  Evening Snack 1 apple   cup almonds without salt   Low-Sodium Vegetarian (Lacto-Ovo) Sample 1-Day Menu  Breakfast 1 cup cooked oatmeal  1 slice whole wheat toast  1 tablespoon peanut butter without salt  1 banana  1 cup 1% milk  Lunch Tacos made with: 2 corn tortillas   cup black beans, low sodium   cup roasted or grilled chicken (without skin)   avocado  Squeeze of lime juice  1 cup salad greens  1 tablespoon low-sodium salad dressing   cup  strawberries  1 orange  Evening Meal Stir fry made with:  cup tofu  1 cup brown rice   cup broccoli   cup green beans   cup peppers   tablespoon peanut oil  1 orange  1 cup 1% milk  Evening Snack 4 strips celery  2 tablespoons hummus  1 hard-boiled egg   Low-Sodium Vegan Sample 1-Day Menu  Breakfast 1 cup cooked oatmeal  1 tablespoon peanut butter without salt  1 cup blueberries  1 cup soymilk fortified with calcium , vitamin B12, and vitamin D  Lunch 1 small whole wheat pita   cup cooked lentils  2 tablespoons hummus  4 carrot sticks  1 medium apple  1 cup soymilk fortified with calcium , vitamin B12, and vitamin D  Evening Meal Stir fry made with:  cup tofu  1 cup brown rice   cup broccoli   cup green beans   cup peppers   tablespoon peanut oil  1 cup cantaloupe  Evening Snack 1 cup soy yogurt   cup mixed nuts  Copyright 2020  [employer]. All rights reserved  Sodium Free Flavoring Tips  When cooking, the following items may be used for flavoring instead of salt or seasonings that contain sodium. Remember: A little bit of spice goes a long way! Be careful not to overseason. Spice Blend Recipe (makes about ? cup) 5 teaspoons onion powder  2 teaspoons garlic powder  2 teaspoons paprika  2 teaspoon dry mustard  1 teaspoon crushed thyme leaves   teaspoon white pepper   teaspoon celery seed Food Item Flavorings  Beef Basil, bay leaf, caraway, curry, dill, dry mustard, garlic, grape jelly, green pepper, mace, marjoram, mushrooms (fresh), nutmeg, onion or onion powder, parsley, pepper, rosemary, sage  Chicken Basil, cloves, cranberries, mace, mushrooms (fresh), nutmeg, oregano, paprika, parsley, pineapple, saffron, sage, savory, tarragon, thyme, tomato, turmeric  Egg Chervil, curry, dill, dry mustard, garlic or garlic powder, green pepper, jelly, mushrooms (fresh), nutmeg, onion powder, paprika, parsley, rosemary, tarragon, tomato   Fish Basil, bay leaf, chervil, curry, dill, dry mustard, green pepper, lemon juice, marjoram, mushrooms (fresh), paprika, pepper, tarragon, tomato, turmeric  Lamb Cloves, curry, dill, garlic or garlic powder, mace, mint, mint jelly, onion, oregano, parsley, pineapple, rosemary, tarragon, thyme  Pork Applesauce, basil, caraway, chives, cloves, garlic or garlic powder, onion or onion powder, rosemary, thyme  Veal Apricots, basil, bay leaf, currant jelly, curry, ginger, marjoram, mushrooms (fresh), oregano, paprika  Vegetables Basil, dill, garlic or garlic powder, ginger, lemon juice, mace, marjoram, nutmeg, onion or onion powder, tarragon, tomato, sugar or sugar substitute, salt-free salad dressing, vinegar  Desserts Allspice, anise, cinnamon, cloves, ginger, mace, nutmeg, vanilla extract, other extracts   Copyright 2020  [employer]. All rights reserved   Information on my medicine - ELIQUIS  (apixaban )  This medication education was reviewed with me or my healthcare representative as part of my discharge preparation.  The pharmacist that spoke with me during my hospital stay was:  Ozell ONEIDA [place], [facility]  Why was Eliquis  prescribed for you? Eliquis  was prescribed to treat blood clots that may have been found in the veins of your legs (deep vein thrombosis) or  in your lungs (pulmonary embolism) and to reduce the risk of them occurring again.  What do You need to know about Eliquis  ? The starting dose is 10 mg (two 5 mg tablets) taken TWICE daily for the FIRST SEVEN (7) DAYS, then on (enter date)  01/24/25  the dose is reduced to ONE 5 mg tablet taken TWICE daily.  Eliquis  may be taken with or without food.   Try to take the dose about the same time in the morning and in the evening. If you have difficulty swallowing the tablet whole please discuss with your pharmacist how to take the medication safely.  Take Eliquis  exactly as prescribed and DO NOT stop taking  Eliquis  without talking to the doctor who prescribed the medication.  Stopping may increase your risk of developing a new blood clot.  Refill your prescription before you run out.  After discharge, you should have regular check-up appointments with your healthcare provider that is prescribing your Eliquis .    What do you do if you miss a dose? If a dose of ELIQUIS  is not taken at the scheduled time, take it as soon as possible on the same day and twice-daily administration should be resumed. The dose should not be doubled to make up for a missed dose.  Important Safety Information A possible side effect of Eliquis  is bleeding. You should call your healthcare provider right away if you experience any of the following: Bleeding from an injury or your nose that does not stop. Unusual colored urine (red or dark brown) or unusual colored stools (red or black). Unusual bruising for unknown reasons. A serious fall or if you hit your head (even if there is no bleeding).  Some medicines may interact with Eliquis  and might increase your risk of bleeding or clotting while on Eliquis . To help avoid this, consult your healthcare provider or pharmacist prior to using any new prescription or non-prescription medications, including herbals, vitamins, non-steroidal anti-inflammatory drugs (NSAIDs) and supplements.  This website has more information on Eliquis  (apixaban ): [email or web address]

## 2025-01-13 NOTE — Progress Notes (Signed)
 " Cardiology Consultation:   Patient ID: Aaron [name] MRN: [id number]; DOB: [birth year]  Admit date: 01/10/2025 Date of Consult: 01/13/2025  Primary Care Provider: Patient, No Pcp Per [facility] Cardiologist: Aaron MARLA Red, MD  [facility] Electrophysiologist:  None   Patient Profile:   Aaron [name] is a 59 y.o. 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   Interval Events:   Overall doing well today.  RFA ecchymosis but hematoma seems stable this morning. Still painful to touch. Main issue now is constipation related to narcotics required for hematoma management. Takes Miralax  at home.   Past Medical History:  Diagnosis Date   Colon polyps    benign per patient   Past Surgical History:  Procedure Laterality Date   COLONOSCOPY W/ BIOPSIES     CORONARY/GRAFT ACUTE MI REVASCULARIZATION N/A 01/10/2025   Procedure: Coronary/Graft Acute MI Revascularization;  Surgeon: [name] Aaron MARLA, MD;  Location: [facility] INVASIVE CV LAB;  Service: Cardiovascular;  Laterality: N/A;   LEFT HEART CATH AND CORONARY ANGIOGRAPHY N/A 01/10/2025   Procedure: LEFT HEART CATH AND CORONARY ANGIOGRAPHY;  Surgeon: [name] Aaron MARLA, MD;  Location: [facility] INVASIVE CV LAB;  Service: Cardiovascular;  Laterality: N/A;   LUMBAR SPINE SURGERY  2012    Inpatient Medications: Scheduled Meds:  acetaminophen   500 mg Oral QID   aspirin   81 mg Oral Daily   atorvastatin   80 mg Oral Daily   Chlorhexidine  Gluconate Cloth  6 each Topical Q0600   losartan   25 mg Oral Daily   metoprolol  succinate  12.5 mg Oral QHS   polyethylene glycol  17 g Oral Daily   senna-docusate  1 tablet Oral QHS   sodium chloride  flush  3 mL Intravenous Q12H   ticagrelor   90 mg Oral BID   Continuous Infusions:  PRN Meds: HYDROcodone -acetaminophen , nitroGLYCERIN , ondansetron  (ZOFRAN ) IV, mouth rinse, sodium chloride  flush  Allergies:   Allergies[1]  Social History:   Social History    Socioeconomic History   Marital status: Single    Spouse name: Not on file   Number of children: 0   Years of education: Not on file   Highest education level: Not on file  Occupational History   Occupation: Rancher  Tobacco Use   Smoking status: Every Day    Current packs/day: 1.00    Average packs/day: 1 pack/day for 13.0 years (13.0 ttl pk-yrs)    Types: Cigarettes   Smokeless tobacco: Former    Types: Chew    Quit date: 10/19/1997  Vaping Use   Vaping status: Never Used  Substance and Sexual Activity   Alcohol use: Yes    Comment: 1-2 beers on occ.   Drug use: Never   Sexual activity: Not on file  Other Topics Concern   Not on file  Social History Narrative   Not on file   Social Drivers of Health   Tobacco Use: High Risk (01/11/2025)   Patient History    Smoking Tobacco Use: Every Day    Smokeless Tobacco Use: Former    Passive Exposure: Not on Actuary Strain: Not on file  Food Insecurity: No Food Insecurity (01/11/2025)   Epic    Worried About Programme Researcher, Broadcasting/film/video in the Last Year: Never true    Ran Out of Food in the Last Year: Never true  Transportation Needs: No Transportation Needs (01/11/2025)   Epic    Lack of Transportation (Medical): No  Lack of Transportation (Non-Medical): No  Physical Activity: Not on file  Stress: Not on file  Social Connections: Not on file  Intimate Partner Violence: Not At Risk (01/11/2025)   Epic    Fear of Current or Ex-Partner: No    Emotionally Abused: No    Physically Abused: No    Sexually Abused: No  Depression (PHQ2-9): Not on file  Alcohol Screen: Not on file  Housing: Low Risk (01/11/2025)   Epic    Unable to Pay for Housing in the Last Year: No    Number of Times Moved in the Last Year: 0    Homeless in the Last Year: No  Utilities: Not At Risk (01/11/2025)   Epic    Threatened with loss of utilities: No  Health Literacy: Not on file    Family History:    Family History  Problem  Relation Age of Onset   Heart disease Mother    Diabetes Father    Pancreatic cancer Father        patient thinks   Breast cancer Maternal Aunt    Colon cancer Neg Hx    Esophageal cancer Neg Hx     ROS:  Review of Systems: [y] = yes, [ ]  = no      General: Weight gain [ ] ; Weight loss [ ] ; Anorexia [ ] ; Fatigue [ ] ; Fever [ ] ; Chills [ ] ; Weakness [ ]    Cardiac: Chest pain/pressure [ ] ; Resting SOB [ ] ; Exertional SOB [ ] ; Orthopnea [ ] ; Pedal Edema [ ] ; Palpitations [ ] ; Syncope [ ] ; Presyncope [ ] ; Paroxysmal nocturnal dyspnea [ ]    Pulmonary: Cough [ ] ; Wheezing [ ] ; Hemoptysis [ ] ; Sputum [ ] ; Snoring [ ]    GI: Vomiting [ ] ; Dysphagia [ ] ; Melena [ ] ; Hematochezia [ ] ; Heartburn [ ] ; Abdominal pain [ ] ; Constipation [ ] ; Diarrhea [ ] ; BRBPR [ ]    GU: Hematuria [ ] ; Dysuria [ ] ; Nocturia [ ]  Vascular: Pain in legs with walking [ ] ; Pain in feet with lying flat [ ] ; Non-healing sores [ ] ; Stroke [ ] ; TIA [ ] ; Slurred speech [ ] ;   Neuro: Headaches [ ] ; Vertigo [ ] ; Seizures [ ] ; Paresthesias [ ] ;Blurred vision [ ] ; Diplopia [ ] ; Vision changes [ ]    Ortho/Skin: Arthritis [ ] ; Joint pain [ ] ; Muscle pain [ ] ; Joint swelling [ ] ; Back Pain [ ] ; Rash [ ]    Psych: Depression [ ] ; Anxiety [ ]    Heme: Bleeding problems [ ] ; Clotting disorders [ ] ; Anemia [ ]    Endocrine: Diabetes [ ] ; Thyroid  dysfunction [ ]    Physical Exam/Data:   Vitals:   01/13/25 0503 01/13/25 0730 01/13/25 1128 01/13/25 1614  BP:  118/85 103/73 119/79  Pulse: 81 96 81 80  Resp: 18 18 20 19   Temp: 98 F (36.7 C) 98.2 F (36.8 C) (!) 97.4 F (36.3 C) 97.9 F (36.6 C)  TempSrc: Oral Oral Oral Oral  SpO2: 100% 100%  98%  Weight:      Height:        Intake/Output Summary (Last 24 hours) at 01/13/2025 1711 Last data filed at 01/13/2025 1616 Gross per 24 hour  Intake --  Output 1220 ml  Net -1220 ml      01/11/2025    8:35 PM 01/10/2025    2:00 PM 05/05/2021   10:31 AM  Last 3 Weights  Weight (lbs) 180 lb  180 lb 205  lb  Weight (kg) 81.647 kg 81.647 kg 92.987 kg     Body mass index is 24.41 kg/m.   General: Well nourished, well developed, in no acute distress HEENT: Normal Neck: No JVD Cardiac: normal S1, S2; RRR; no murmur  Lungs: Clear to auscultation bilaterally, no wheezing, rhonchi or rales  Ext: No edema Musculoskeletal: No deformities, BUE and BLE strength normal and equal Skin: warm and dry, RFA access site with surrounding ecchymosis but without evolving hematoma, soft but tender Neuro: CNs 2-12 intact, no focal abnormalities noted Psych: Normal affect   Telemetry: personally reviewed and demonstrates NSR 80-90s   Relevant CV Studies:  TTE Result date: 01/11/25  1. Left ventricular ejection fraction, by estimation, is 45 to 50%. The  left ventricle has mildly decreased function. The left ventricle  demonstrates regional wall motion abnormalities (see scoring  diagram/findings for description). Left ventricular  diastolic parameters were normal.   2. Right ventricular systolic function is normal. The right ventricular  size is normal.   3. The mitral valve is normal in structure. No evidence of mitral valve  regurgitation. No evidence of mitral stenosis.   4. The aortic valve is tricuspid. Aortic valve regurgitation is not  visualized. Aortic valve sclerosis is present, with no evidence of aortic  valve stenosis.   5. The inferior vena cava is normal in size with greater than 50%  respiratory variability, suggesting right atrial pressure of 3 mmHg.   Coronary angiography+PCI  Result date: 01/10/25   Mid LAD lesion is 100% stenosed.   A stent was successfully placed.   Post intervention, there is a 0% residual stenosis. 1.  100% thrombotic occluded mid LAD lesion treated with 2.75 x 24 mm Synergy XD stent postdilated to 3.0 mm. 2.  LVEDP of 26 mmHg with ejection fraction of around 40% with anterior/apical hypokinesis 3.  Unable to place right radial sheath due to  tortuous radial artery necessitating right femoral approach. 4.  POC lactate decreased from 2.16 to 1.59.  Laboratory Data:  Chemistry Recent Labs  Lab 01/11/25 0029 01/12/25 0541 01/13/25 0502  NA 142 142 142  K 3.4* 3.7 4.0  CL 105 109 110  CO2 21* 24 23  GLUCOSE 139* 127* 111*  BUN 11 10 12   CREATININE 0.82 0.75 0.81  CALCIUM  8.9 8.7* 8.9  GFRNONAA >60 >60 >60  ANIONGAP 16* 10 10    Recent Labs  Lab 01/10/25 1438  PROT 6.0*  ALBUMIN 3.9  AST 22  ALT 17  ALKPHOS 78  BILITOT 0.3   Hematology Recent Labs  Lab 01/10/25 1438 01/10/25 1440 01/11/25 0029 01/11/25 0556  WBC 12.9*  --  16.5* 18.2*  RBC 4.16*  --  4.55 4.02*  HGB 14.1 13.9 15.2 13.6  HCT 39.7 41.0 42.5 38.5*  MCV 95.4  --  93.4 95.8  MCH 33.9  --  33.4 33.8  MCHC 35.5  --  35.8 35.3  RDW 11.5  --  11.3* 11.4*  PLT 264  --  263 259   Radiology/Studies:  ECHOCARDIOGRAM COMPLETE Result Date: 01/11/2025    ECHOCARDIOGRAM REPORT   Patient Name:   Aaron [name] Date of Exam: 01/11/2025 Medical Rec #:  [id number]       Height:       72.0 in Accession #:    [id number]      Weight:       180.0 lb Date of Birth:  June 19, 1966        BSA:  2.037 m Patient Age:    59 years        BP:           140/89 mmHg Patient Gender: M               HR:           81 bpm. Exam Location:  Inpatient Procedure: 2D Echo, Cardiac Doppler, Color Doppler and Intracardiac            Opacification Agent (Both Spectral and Color Flow Doppler were            utilized during procedure). Indications:    Acute myocardial infarction, unspecified I21.9  History:        Patient has no prior history of Echocardiogram examinations.                 STEMI; Risk Factors:Current Smoker.  Sonographer:    Aaron [name] RDCS Referring Phys: Aaron [name]  Sonographer Comments: Image acquisition challenging due to respiratory motion. IMPRESSIONS  1. Left ventricular ejection fraction, by estimation, is 45 to 50%. The left ventricle has mildly  decreased function. The left ventricle demonstrates regional wall motion abnormalities (see scoring diagram/findings for description). Left ventricular diastolic parameters were normal.  2. Right ventricular systolic function is normal. The right ventricular size is normal.  3. The mitral valve is normal in structure. No evidence of mitral valve regurgitation. No evidence of mitral stenosis.  4. The aortic valve is tricuspid. Aortic valve regurgitation is not visualized. Aortic valve sclerosis is present, with no evidence of aortic valve stenosis.  5. The inferior vena cava is normal in size with greater than 50% respiratory variability, suggesting right atrial pressure of 3 mmHg. Comparison(s): No prior Echocardiogram. FINDINGS  Left Ventricle: Left ventricular ejection fraction, by estimation, is 45 to 50%. The left ventricle has mildly decreased function. The left ventricle demonstrates regional wall motion abnormalities. Definity  contrast agent was given IV to delineate the left ventricular endocardial borders. The left ventricular internal cavity size was normal in size. There is no left ventricular hypertrophy. Left ventricular diastolic parameters were normal.  LV Wall Scoring: The mid and distal anterior septum, mid inferoseptal segment, apical anterior segment, apical inferior segment, and apex are hypokinetic. The anterior wall, entire lateral wall, inferior wall, basal anteroseptal segment, and basal inferoseptal segment are normal. Right Ventricle: The right ventricular size is normal. No increase in right ventricular wall thickness. Right ventricular systolic function is normal. Left Atrium: Left atrial size was normal in size. Right Atrium: Right atrial size was normal in size. Pericardium: There is no evidence of pericardial effusion. Mitral Valve: The mitral valve is normal in structure. No evidence of mitral valve regurgitation. No evidence of mitral valve stenosis. Tricuspid Valve: The tricuspid  valve is normal in structure. Tricuspid valve regurgitation is not demonstrated. No evidence of tricuspid stenosis. Aortic Valve: The aortic valve is tricuspid. Aortic valve regurgitation is not visualized. Aortic valve sclerosis is present, with no evidence of aortic valve stenosis. Pulmonic Valve: The pulmonic valve was normal in structure. Pulmonic valve regurgitation is not visualized. No evidence of pulmonic stenosis. Aorta: The aortic root and ascending aorta are structurally normal, with no evidence of dilitation. Venous: The inferior vena cava is normal in size with greater than 50% respiratory variability, suggesting right atrial pressure of 3 mmHg. IAS/Shunts: No atrial level shunt detected by color flow Doppler.  LEFT VENTRICLE PLAX 2D LVIDd:  4.30 cm      Diastology LVIDs:         2.80 cm      LV e' medial:    7.94 cm/s LV PW:         1.20 cm      LV E/e' medial:  8.7 LV IVS:        1.10 cm      LV e' lateral:   11.00 cm/s LVOT diam:     1.90 cm      LV E/e' lateral: 6.2 LV SV:         50 LV SV Index:   24 LVOT Area:     2.84 cm  LV Volumes (MOD) LV vol d, MOD A2C: 100.0 ml LV vol d, MOD A4C: 134.0 ml LV vol s, MOD A2C: 53.1 ml LV vol s, MOD A4C: 52.6 ml LV SV MOD A2C:     46.9 ml LV SV MOD A4C:     134.0 ml LV SV MOD BP:      67.4 ml RIGHT VENTRICLE             IVC RV Basal diam:  3.70 cm     IVC diam: 1.90 cm RV S prime:     12.40 cm/s TAPSE (M-mode): 1.8 cm LEFT ATRIUM             Index        RIGHT ATRIUM           Index LA diam:        2.80 cm 1.37 cm/m   RA Area:     14.70 cm LA Vol (A2C):   35.0 ml 17.18 ml/m  RA Volume:   40.40 ml  19.83 ml/m LA Vol (A4C):   26.4 ml 12.96 ml/m LA Biplane Vol: 33.6 ml 16.49 ml/m  AORTIC VALVE LVOT Vmax:   100.00 cm/s LVOT Vmean:  62.900 cm/s LVOT VTI:    0.176 m  AORTA Ao Root diam: 3.00 cm Ao Asc diam:  3.50 cm MITRAL VALVE MV Area (PHT): 5.13 cm    SHUNTS MV Decel Time: 148 msec    Systemic VTI:  0.18 m MV E velocity: 68.70 cm/s  Systemic Diam:  1.90 cm MV A velocity: 65.00 cm/s MV E/A ratio:  1.06 Sunit Tolia Electronically signed by Aaron [name] Signature Date/Time: 01/11/2025/11:49:58 AM    Final    CT ABDOMEN PELVIS WO CONTRAST Result Date: 01/11/2025 CLINICAL DATA:  Recent cardiac catheterization. Right femoral hematoma with scrotal swelling. Evaluate for retroperitoneal hemorrhage. EXAM: CT ABDOMEN AND PELVIS WITHOUT CONTRAST TECHNIQUE: Multidetector CT imaging of the abdomen and pelvis was performed following the standard protocol without IV contrast. RADIATION DOSE REDUCTION: This exam was performed according to the departmental dose-optimization program which includes automated exposure control, adjustment of the mA and/or kV according to patient size and/or use of iterative reconstruction technique. COMPARISON:  None Available. FINDINGS: Lower chest: No acute findings. Hepatobiliary: 2.3 cm left hepatic cyst. Scattered tiny hypodensities in the liver parenchyma are too small to characterize but are statistically most likely benign. No followup imaging is recommended. High density material in the lumen of the gallbladder is likely vicarious excretion of contrast. No intrahepatic or extrahepatic biliary dilation. Pancreas: No focal mass lesion. No dilatation of the main duct. No intraparenchymal cyst. No peripancreatic edema. Spleen: No splenomegaly. No suspicious focal mass lesion. Adrenals/Urinary Tract: Left adrenal gland unremarkable. 8 mm right adrenal nodule cannot be definitively characterized but is likely  benign. Right kidney unremarkable. Punctate stone identified lower pole left kidney without hydronephrosis. No evidence for hydroureter. The urinary bladder appears normal for the degree of distention. Anterior left bladder protrudes towards the left groin hernia. Stomach/Bowel: Moderate distention of the stomach with food and fluid. Duodenum is normally positioned as is the ligament of Treitz. No small bowel wall thickening. No small  bowel dilatation. The terminal ileum is normal. The appendix is normal. No gross colonic mass. No colonic wall thickening. Diverticular changes are noted in the left colon without evidence of diverticulitis. Vascular/Lymphatic: There is mild atherosclerotic calcification of the abdominal aorta without aneurysm. There is no gastrohepatic or hepatoduodenal ligament lymphadenopathy. No retroperitoneal or mesenteric lymphadenopathy. No pelvic sidewall lymphadenopathy. Reproductive: The prostate gland and seminal vesicles are unremarkable. Other: No substantial intraperitoneal free fluid. Musculoskeletal: Left-sided groin hernia contains only fat. No retroperitoneal hemorrhage in the abdomen or pelvis. There is evidence of hemorrhage in the right groin region with apparent blood products in the right inguinal canal and a probable right inguinal hematoma measuring on the order of 6.5 x 4.7 x 11.0 cm. There is extensive edema in the right groin region and scrotum. IMPRESSION: 1. No evidence for retroperitoneal hemorrhage in the abdomen or pelvis. 2. 6.5 x 4.7 x 11.0 cm right groin/inguinal canal hematoma. There is extensive edema in the right groin region and scrotum. 3. Anterior left bladder protrudes towards the fat containing contralateral left groin hernia. 4. Left colonic diverticulosis without diverticulitis. 5. Punctate left renal stone. 6. 8 mm right adrenal nodule cannot be definitively characterized but is likely benign. 7.  Aortic Atherosclerosis (ICD10-I70.0). Electronically Signed   By: Camellia Candle M.D.   On: 01/11/2025 08:38   CARDIAC CATHETERIZATION Result Date: 01/10/2025   Mid LAD lesion is 100% stenosed.   A stent was successfully placed.   Post intervention, there is a 0% residual stenosis. 1.  100% thrombotic occluded mid LAD lesion treated with 2.75 x 24 mm Synergy XD stent postdilated to 3.0 mm. 2.  LVEDP of 26 mmHg with ejection fraction of around 40% with anterior/apical hypokinesis 3.  Unable  to place right radial sheath due to tortuous radial artery necessitating right femoral approach. 4.  POC lactate decreased from 2.16 to 1.59. Summary: Femoral sheath will be removed when ACT is appropriate.  Continue dual antiplatelet therapy with aspirin  and ticagrelor  for 1 year.  Admitted to heart unit.  Monitor on telemetry.  Obtain echocardiogram.  I left a message with the patient's aunt Gaynelle regarding the results of the procedure.   Assessment and Plan:  Aaron [name] is a 59 y.o. 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   Anterior STEMI  CAD S/p PCI of mLAD with 100% thrombotic occlusion.  Continue DAPT with aspirin /Brilinta  for 1 year.  No recurrent symptoms. - continue atorva 80 mg daily  Acute HFmrEF - continue Toprol  XL, increase from 12.5 mg at bedtime to 25 mg at bedtime (01/24 PM)  - continue losartan  25 mg daily   HLDD - continue atorva 80 mg daily   R groin hematoma Scrotal edema B oth appear stable, improving. Pain is much better today than yesterday.   R renal nodule  OP f/u, noted on CT, likely benign.   Constipation  Continuing on MiraLAX  daily and Senokot-S. Minimize narcotics.   For questions or updates, please contact [name] Please consult [email or web address] for contact info under   Signed,  Aaron DELENA Primus, MD  01/13/2025 5:11 PM     [1] No Known Allergies  "

## 2025-01-13 NOTE — Progress Notes (Signed)
 Mobility Specialist Progress Note:    01/13/25 1128  Therapy Vitals  Temp (!) 97.4 F (36.3 C)  Temp Source Oral  Pulse Rate 81  Resp 20  BP 103/73  Patient Position (if appropriate) Lying  Oxygen Therapy  O2 Device Room Air  Mobility  Activity Stood with assistance  Level of Assistance Contact guard assist, steadying assist  Assistive Device Front wheel walker  Activity Response Tolerated fair  Mobility Referral Yes  Mobility visit 1 Mobility  Mobility Specialist Start Time (ACUTE ONLY) 1110  Mobility Specialist Stop Time (ACUTE ONLY) 1125  Mobility Specialist Time Calculation (min) (ACUTE ONLY) 15 min   Pt received in bed agreeable to mobility. Able to perform bed mobility independently. Upon sitted EOB pt c/o groin pain. Able to stand w/ no physical assistance upon standing for~2 mins pt c/o feeling sick to the stomach needing to sit back down. Pt became very sweaty and hot. Deferred further mobility. Pt returned to supine in bed. Took BP, 116/84 in supine HR 81. Left w/ RN in room w/ call bell and personal belongings in reach. All needs met. Bed alarm on.  Thersia Minder Mobility Specialist  Please contact vis Secure Chat or  Rehab Office [phone]

## 2025-01-13 NOTE — Plan of Care (Signed)
" °  Problem: Clinical Measurements: Goal: Respiratory complications will improve Outcome: Progressing Goal: Cardiovascular complication will be avoided Outcome: Progressing   Problem: Activity: Goal: Risk for activity intolerance will decrease Outcome: Progressing   Problem: Nutrition: Goal: Adequate nutrition will be maintained Outcome: Progressing   Problem: Coping: Goal: Level of anxiety will decrease Outcome: Progressing   Problem: Safety: Goal: Ability to remain free from injury will improve Outcome: Progressing   Problem: Activity: Goal: Ability to return to baseline activity level will improve Outcome: Progressing   Problem: Cardiovascular: Goal: Ability to achieve and maintain adequate cardiovascular perfusion will improve Outcome: Progressing Goal: Vascular access site(s) Level 0-1 will be maintained Outcome: Progressing   "

## 2025-01-13 NOTE — Plan of Care (Signed)

## 2025-01-14 LAB — BASIC METABOLIC PANEL WITH GFR
Anion gap: 10 (ref 5–15)
BUN: 13 mg/dL (ref 6–20)
CO2: 23 mmol/L (ref 22–32)
Calcium: 8.9 mg/dL (ref 8.9–10.3)
Chloride: 108 mmol/L (ref 98–111)
Creatinine, Ser: 0.81 mg/dL (ref 0.61–1.24)
GFR, Estimated: >60 mL/min
Glucose, Bld: 107 mg/dL — ABNORMAL HIGH (ref 70–99)
Potassium: 3.6 mmol/L (ref 3.5–5.1)
Sodium: 141 mmol/L (ref 135–145)

## 2025-01-14 MED ORDER — MAGNESIUM CITRATE PO SOLN
1.0000 | Freq: Once | ORAL | Status: AC
  Administered 2025-01-14: 1 via ORAL
  Filled 2025-01-14: qty 296

## 2025-01-14 NOTE — Plan of Care (Signed)

## 2025-01-14 NOTE — Progress Notes (Signed)
 " Cardiology Consultation:   Patient ID: Aaron [name] MRN: [id number]; DOB: 1966-02-24  Admit date: 01/10/2025 Date of Consult: 01/14/2025  Primary Care Provider: Patient, No Pcp Per [facility] Cardiologist: Lurena MARLA Red, MD  [facility] Electrophysiologist:  None   Patient Profile:   Aaron [name] is a 59 y.o. 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    History of Present Illness:   Overall doing well today.  RFA ecchymosis but hematoma seems stable this morning. Still painful to touch. Main issue now is constipation related to narcotics required for hematoma management. Takes Miralax  at home. Will give suppository tomorrow if no BM still.   Past Medical History:  Diagnosis Date   Colon polyps    benign per patient   Past Surgical History:  Procedure Laterality Date   COLONOSCOPY W/ BIOPSIES     CORONARY/GRAFT ACUTE MI REVASCULARIZATION N/A 01/10/2025   Procedure: Coronary/Graft Acute MI Revascularization;  Surgeon: Red Lurena MARLA, MD;  Location: [facility] INVASIVE CV [facility];  Service: Cardiovascular;  Laterality: N/A;   LEFT HEART CATH AND CORONARY ANGIOGRAPHY N/A 01/10/2025   Procedure: LEFT HEART CATH AND CORONARY ANGIOGRAPHY;  Surgeon: Red Lurena MARLA, MD;  Location: [facility] INVASIVE CV LAB;  Service: Cardiovascular;  Laterality: N/A;   LUMBAR SPINE SURGERY  2012    Inpatient Medications: Scheduled Meds:  acetaminophen   500 mg Oral QID   aspirin   81 mg Oral Daily   atorvastatin   80 mg Oral Daily   Chlorhexidine  Gluconate Cloth  6 each Topical Q0600   losartan   25 mg Oral Daily   metoprolol  succinate  25 mg Oral QHS   polyethylene glycol  17 g Oral Daily   senna-docusate  1 tablet Oral QHS   sodium chloride  flush  3 mL Intravenous Q12H   ticagrelor   90 mg Oral BID   Continuous Infusions:  PRN Meds: bisacodyl , HYDROcodone -acetaminophen , nitroGLYCERIN , ondansetron  (ZOFRAN ) IV, mouth rinse, sodium chloride   flush  Allergies:   Allergies[1]  Social History:   Social History   Socioeconomic History   Marital status: Single    Spouse name: Not on file   Number of children: 0   Years of education: Not on file   Highest education level: Not on file  Occupational History   Occupation: Rancher  Tobacco Use   Smoking status: Every Day    Current packs/day: 1.00    Average packs/day: 1 pack/day for 13.0 years (13.0 ttl pk-yrs)    Types: Cigarettes   Smokeless tobacco: Former    Types: Chew    Quit date: 10/19/1997  Vaping Use   Vaping status: Never Used  Substance and Sexual Activity   Alcohol use: Yes    Comment: 1-2 beers on occ.   Drug use: Never   Sexual activity: Not on file  Other Topics Concern   Not on file  Social History Narrative   Not on file   Social Drivers of Health   Tobacco Use: High Risk (01/11/2025)   Patient History    Smoking Tobacco Use: Every Day    Smokeless Tobacco Use: Former    Passive Exposure: Not on Actuary Strain: Not on file  Food Insecurity: No Food Insecurity (01/11/2025)   Epic    Worried About Radiation Protection Practitioner of Food in the Last Year: Never true    Ran Out of Food in the Last Year: Never true  Transportation Needs: No Transportation Needs (01/11/2025)  Epic    Lack of Transportation (Medical): No    Lack of Transportation (Non-Medical): No  Physical Activity: Not on file  Stress: Not on file  Social Connections: Not on file  Intimate Partner Violence: Not At Risk (01/11/2025)   Epic    Fear of Current or Ex-Partner: No    Emotionally Abused: No    Physically Abused: No    Sexually Abused: No  Depression (PHQ2-9): Not on file  Alcohol Screen: Not on file  Housing: Low Risk (01/11/2025)   Epic    Unable to Pay for Housing in the Last Year: No    Number of Times Moved in the Last Year: 0    Homeless in the Last Year: No  Utilities: Not At Risk (01/11/2025)   Epic    Threatened with loss of utilities: No  Health  Literacy: Not on file    Family History:    Family History  Problem Relation Age of Onset   Heart disease Mother    Diabetes Father    Pancreatic cancer Father        patient thinks   Breast cancer Maternal Aunt    Colon cancer Neg Hx    Esophageal cancer Neg Hx      ROS:  Review of Systems: [y] = yes, [ ]  = no      General: Weight gain [ ] ; Weight loss [ ] ; Anorexia [ ] ; Fatigue [ ] ; Fever [ ] ; Chills [ ] ; Weakness [ ]    Cardiac: Chest pain/pressure [ ] ; Resting SOB [ ] ; Exertional SOB [ ] ; Orthopnea [ ] ; Pedal Edema [ ] ; Palpitations [ ] ; Syncope [ ] ; Presyncope [ ] ; Paroxysmal nocturnal dyspnea [ ]    Pulmonary: Cough [ ] ; Wheezing [ ] ; Hemoptysis [ ] ; Sputum [ ] ; Snoring [ ]    GI: Vomiting [ ] ; Dysphagia [ ] ; Melena [ ] ; Hematochezia [ ] ; Heartburn [ ] ; Abdominal pain [ ] ; Constipation [y]; Diarrhea [ ] ; BRBPR [ ]    GU: Hematuria [ ] ; Dysuria [ ] ; Nocturia [ ]  Vascular: Pain in legs with walking [ ] ; Pain in feet with lying flat [ ] ; Non-healing sores [ ] ; Stroke [ ] ; TIA [ ] ; Slurred speech [ ] ;   Neuro: Headaches [ ] ; Vertigo [ ] ; Seizures [ ] ; Paresthesias [ ] ;Blurred vision [ ] ; Diplopia [ ] ; Vision changes [ ]    Ortho/Skin: Arthritis [ ] ; Joint pain [ ] ; Muscle pain [ ] ; Joint swelling [ ] ; Back Pain [ ] ; Rash [ ]    Psych: Depression [ ] ; Anxiety [ ]    Heme: Bleeding problems [ ] ; Clotting disorders [ ] ; Anemia [ ]    Endocrine: Diabetes [ ] ; Thyroid  dysfunction [ ]    Physical Exam/Data:   Vitals:   01/13/25 2100 01/13/25 2153 01/14/25 0103 01/14/25 0459  BP:  118/79 117/81 119/73  Pulse: 97 84 88 96  Resp: 18  18 18   Temp: 98 F (36.7 C)  98.2 F (36.8 C) 97.7 F (36.5 C)  TempSrc: Oral  Oral Oral  SpO2: 98%  97% 94%  Weight:      Height:       Intake/Output Summary (Last 24 hours) at 01/14/2025 1200 Last data filed at 01/14/2025 0501 Gross per 24 hour  Intake 300 ml  Output 1150 ml  Net -850 ml      01/11/2025    8:35 PM 01/10/2025    2:00 PM 05/05/2021    10:31 AM  Last 3 Weights  Weight (lbs) 180 lb 180 lb 205 lb  Weight (kg) 81.647 kg 81.647 kg 92.987 kg     Body mass index is 24.41 kg/m.  General: Well nourished, well developed, in no acute distress HEENT: Normal Neck: No JVD Cardiac: normal S1, S2; RRR; no murmur  Lungs: Clear to auscultation bilaterally, no wheezing, rhonchi or rales  Ext: No edema Musculoskeletal: No deformities, BUE and BLE strength normal and equal Skin: warm and dry, RFA access site with surrounding ecchymosis but without evolving hematoma, soft but tender Neuro: CNs 2-12 intact, no focal abnormalities noted Psych: Normal affect   Telemetry: personally reviewed and demonstrates NSR 80-90s    Relevant CV Studies:   TTE Result date: 01/11/25  1. Left ventricular ejection fraction, by estimation, is 45 to 50%. The  left ventricle has mildly decreased function. The left ventricle  demonstrates regional wall motion abnormalities (see scoring  diagram/findings for description). Left ventricular  diastolic parameters were normal.   2. Right ventricular systolic function is normal. The right ventricular  size is normal.   3. The mitral valve is normal in structure. No evidence of mitral valve  regurgitation. No evidence of mitral stenosis.   4. The aortic valve is tricuspid. Aortic valve regurgitation is not  visualized. Aortic valve sclerosis is present, with no evidence of aortic  valve stenosis.   5. The inferior vena cava is normal in size with greater than 50%  respiratory variability, suggesting right atrial pressure of 3 mmHg.    Coronary angiography+PCI  Result date: 01/10/25   Mid LAD lesion is 100% stenosed.   A stent was successfully placed.   Post intervention, there is a 0% residual stenosis. 1.  100% thrombotic occluded mid LAD lesion treated with 2.75 x 24 mm Synergy XD stent postdilated to 3.0 mm. 2.  LVEDP of 26 mmHg with ejection fraction of around 40% with anterior/apical  hypokinesis 3.  Unable to place right radial sheath due to tortuous radial artery necessitating right femoral approach. 4.  POC lactate decreased from 2.16 to 1.59.   Chemistry Recent Labs  Lab 01/12/25 0541 01/13/25 0502 01/14/25 0315  NA 142 142 141  K 3.7 4.0 3.6  CL 109 110 108  CO2 24 23 23   GLUCOSE 127* 111* 107*  BUN 10 12 13   CREATININE 0.75 0.81 0.81  CALCIUM  8.7* 8.9 8.9  GFRNONAA >60 >60 >60  ANIONGAP 10 10 10     Recent Labs  Lab 01/10/25 1438  PROT 6.0*  ALBUMIN 3.9  AST 22  ALT 17  ALKPHOS 78  BILITOT 0.3   Hematology Recent Labs  Lab 01/10/25 1438 01/10/25 1440 01/11/25 0029 01/11/25 0556  WBC 12.9*  --  16.5* 18.2*  RBC 4.16*  --  4.55 4.02*  HGB 14.1 13.9 15.2 13.6  HCT 39.7 41.0 42.5 38.5*  MCV 95.4  --  93.4 95.8  MCH 33.9  --  33.4 33.8  MCHC 35.5  --  35.8 35.3  RDW 11.5  --  11.3* 11.4*  PLT 264  --  263 259   Radiology/Studies:  ECHOCARDIOGRAM COMPLETE Result Date: 01/11/2025    ECHOCARDIOGRAM REPORT   Patient Name:   Toribio Seiber Date of Exam: 01/11/2025 Medical Rec #:  [id number]       Height:       72.0 in Accession #:    [id number]      Weight:       180.0 lb Date of Birth:  1966-11-25  BSA:          2.037 m Patient Age:    59 years        BP:           140/89 mmHg Patient Gender: M               HR:           81 bpm. Exam Location:  Inpatient Procedure: 2D Echo, Cardiac Doppler, Color Doppler and Intracardiac            Opacification Agent (Both Spectral and Color Flow Doppler were            utilized during procedure). Indications:    Acute myocardial infarction, unspecified I21.9  History:        Patient has no prior history of Echocardiogram examinations.                 STEMI; Risk Factors:Current Smoker.  Sonographer:    Koleen Popper RDCS Referring Phys: ARUN K THUKKANI  Sonographer Comments: Image acquisition challenging due to respiratory motion. IMPRESSIONS  1. Left ventricular ejection fraction, by estimation, is 45 to 50%.  The left ventricle has mildly decreased function. The left ventricle demonstrates regional wall motion abnormalities (see scoring diagram/findings for description). Left ventricular diastolic parameters were normal.  2. Right ventricular systolic function is normal. The right ventricular size is normal.  3. The mitral valve is normal in structure. No evidence of mitral valve regurgitation. No evidence of mitral stenosis.  4. The aortic valve is tricuspid. Aortic valve regurgitation is not visualized. Aortic valve sclerosis is present, with no evidence of aortic valve stenosis.  5. The inferior vena cava is normal in size with greater than 50% respiratory variability, suggesting right atrial pressure of 3 mmHg. Comparison(s): No prior Echocardiogram. FINDINGS  Left Ventricle: Left ventricular ejection fraction, by estimation, is 45 to 50%. The left ventricle has mildly decreased function. The left ventricle demonstrates regional wall motion abnormalities. Definity  contrast agent was given IV to delineate the left ventricular endocardial borders. The left ventricular internal cavity size was normal in size. There is no left ventricular hypertrophy. Left ventricular diastolic parameters were normal.  LV Wall Scoring: The mid and distal anterior septum, mid inferoseptal segment, apical anterior segment, apical inferior segment, and apex are hypokinetic. The anterior wall, entire lateral wall, inferior wall, basal anteroseptal segment, and basal inferoseptal segment are normal. Right Ventricle: The right ventricular size is normal. No increase in right ventricular wall thickness. Right ventricular systolic function is normal. Left Atrium: Left atrial size was normal in size. Right Atrium: Right atrial size was normal in size. Pericardium: There is no evidence of pericardial effusion. Mitral Valve: The mitral valve is normal in structure. No evidence of mitral valve regurgitation. No evidence of mitral valve stenosis.  Tricuspid Valve: The tricuspid valve is normal in structure. Tricuspid valve regurgitation is not demonstrated. No evidence of tricuspid stenosis. Aortic Valve: The aortic valve is tricuspid. Aortic valve regurgitation is not visualized. Aortic valve sclerosis is present, with no evidence of aortic valve stenosis. Pulmonic Valve: The pulmonic valve was normal in structure. Pulmonic valve regurgitation is not visualized. No evidence of pulmonic stenosis. Aorta: The aortic root and ascending aorta are structurally normal, with no evidence of dilitation. Venous: The inferior vena cava is normal in size with greater than 50% respiratory variability, suggesting right atrial pressure of 3 mmHg. IAS/Shunts: No atrial level shunt detected by color flow Doppler.  LEFT VENTRICLE PLAX  2D LVIDd:         4.30 cm      Diastology LVIDs:         2.80 cm      LV e' medial:    7.94 cm/s LV PW:         1.20 cm      LV E/e' medial:  8.7 LV IVS:        1.10 cm      LV e' lateral:   11.00 cm/s LVOT diam:     1.90 cm      LV E/e' lateral: 6.2 LV SV:         50 LV SV Index:   24 LVOT Area:     2.84 cm  LV Volumes (MOD) LV vol d, MOD A2C: 100.0 ml LV vol d, MOD A4C: 134.0 ml LV vol s, MOD A2C: 53.1 ml LV vol s, MOD A4C: 52.6 ml LV SV MOD A2C:     46.9 ml LV SV MOD A4C:     134.0 ml LV SV MOD BP:      67.4 ml RIGHT VENTRICLE             IVC RV Basal diam:  3.70 cm     IVC diam: 1.90 cm RV S prime:     12.40 cm/s TAPSE (M-mode): 1.8 cm LEFT ATRIUM             Index        RIGHT ATRIUM           Index LA diam:        2.80 cm 1.37 cm/m   RA Area:     14.70 cm LA Vol (A2C):   35.0 ml 17.18 ml/m  RA Volume:   40.40 ml  19.83 ml/m LA Vol (A4C):   26.4 ml 12.96 ml/m LA Biplane Vol: 33.6 ml 16.49 ml/m  AORTIC VALVE LVOT Vmax:   100.00 cm/s LVOT Vmean:  62.900 cm/s LVOT VTI:    0.176 m  AORTA Ao Root diam: 3.00 cm Ao Asc diam:  3.50 cm MITRAL VALVE MV Area (PHT): 5.13 cm    SHUNTS MV Decel Time: 148 msec    Systemic VTI:  0.18 m MV E  velocity: 68.70 cm/s  Systemic Diam: 1.90 cm MV A velocity: 65.00 cm/s MV E/A ratio:  1.06 Sunit Tolia Electronically signed by Madonna Large Signature Date/Time: 01/11/2025/11:49:58 AM    Final    CT ABDOMEN PELVIS WO CONTRAST Result Date: 01/11/2025 CLINICAL DATA:  Recent cardiac catheterization. Right femoral hematoma with scrotal swelling. Evaluate for retroperitoneal hemorrhage. EXAM: CT ABDOMEN AND PELVIS WITHOUT CONTRAST TECHNIQUE: Multidetector CT imaging of the abdomen and pelvis was performed following the standard protocol without IV contrast. RADIATION DOSE REDUCTION: This exam was performed according to the departmental dose-optimization program which includes automated exposure control, adjustment of the mA and/or kV according to patient size and/or use of iterative reconstruction technique. COMPARISON:  None Available. FINDINGS: Lower chest: No acute findings. Hepatobiliary: 2.3 cm left hepatic cyst. Scattered tiny hypodensities in the liver parenchyma are too small to characterize but are statistically most likely benign. No followup imaging is recommended. High density material in the lumen of the gallbladder is likely vicarious excretion of contrast. No intrahepatic or extrahepatic biliary dilation. Pancreas: No focal mass lesion. No dilatation of the main duct. No intraparenchymal cyst. No peripancreatic edema. Spleen: No splenomegaly. No suspicious focal mass lesion. Adrenals/Urinary Tract: Left adrenal gland unremarkable. 8 mm  right adrenal nodule cannot be definitively characterized but is likely benign. Right kidney unremarkable. Punctate stone identified lower pole left kidney without hydronephrosis. No evidence for hydroureter. The urinary bladder appears normal for the degree of distention. Anterior left bladder protrudes towards the left groin hernia. Stomach/Bowel: Moderate distention of the stomach with food and fluid. Duodenum is normally positioned as is the ligament of Treitz. No  small bowel wall thickening. No small bowel dilatation. The terminal ileum is normal. The appendix is normal. No gross colonic mass. No colonic wall thickening. Diverticular changes are noted in the left colon without evidence of diverticulitis. Vascular/Lymphatic: There is mild atherosclerotic calcification of the abdominal aorta without aneurysm. There is no gastrohepatic or hepatoduodenal ligament lymphadenopathy. No retroperitoneal or mesenteric lymphadenopathy. No pelvic sidewall lymphadenopathy. Reproductive: The prostate gland and seminal vesicles are unremarkable. Other: No substantial intraperitoneal free fluid. Musculoskeletal: Left-sided groin hernia contains only fat. No retroperitoneal hemorrhage in the abdomen or pelvis. There is evidence of hemorrhage in the right groin region with apparent blood products in the right inguinal canal and a probable right inguinal hematoma measuring on the order of 6.5 x 4.7 x 11.0 cm. There is extensive edema in the right groin region and scrotum. IMPRESSION: 1. No evidence for retroperitoneal hemorrhage in the abdomen or pelvis. 2. 6.5 x 4.7 x 11.0 cm right groin/inguinal canal hematoma. There is extensive edema in the right groin region and scrotum. 3. Anterior left bladder protrudes towards the fat containing contralateral left groin hernia. 4. Left colonic diverticulosis without diverticulitis. 5. Punctate left renal stone. 6. 8 mm right adrenal nodule cannot be definitively characterized but is likely benign. 7.  Aortic Atherosclerosis (ICD10-I70.0). Electronically Signed   By: Camellia Candle M.D.   On: 01/11/2025 08:38   CARDIAC CATHETERIZATION Result Date: 01/10/2025   Mid LAD lesion is 100% stenosed.   A stent was successfully placed.   Post intervention, there is a 0% residual stenosis. 1.  100% thrombotic occluded mid LAD lesion treated with 2.75 x 24 mm Synergy XD stent postdilated to 3.0 mm. 2.  LVEDP of 26 mmHg with ejection fraction of around 40% with  anterior/apical hypokinesis 3.  Unable to place right radial sheath due to tortuous radial artery necessitating right femoral approach. 4.  POC lactate decreased from 2.16 to 1.59. Summary: Femoral sheath will be removed when ACT is appropriate.  Continue dual antiplatelet therapy with aspirin  and ticagrelor  for 1 year.  Admitted to heart unit.  Monitor on telemetry.  Obtain echocardiogram.  I left a message with the patient's aunt Gaynelle regarding the results of the procedure.   Assessment and Plan:  Kemuel Buchmann is a 59 y.o. 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    Anterior STEMI  CAD S/p PCI of mLAD with 100% thrombotic occlusion.  Continue DAPT with aspirin /Brilinta  for 1 year.  No recurrent symptoms. - continue atorva 80 mg daily   Acute HFmrEF - continue Toprol  XL, increased from 12.5 mg at bedtime to 25 mg at bedtime (01/24 PM)  - continue losartan  25 mg daily    HLDD - continue atorva 80 mg daily    R groin hematoma Scrotal edema B oth appear stable, improving. Pain continues to improve.    R renal nodule  OP f/u, noted on CT, likely benign.    Constipation  Continuing on MiraLAX  daily and Senokot-S. Minimize narcotics. Feels like he will have BM today, walked  earlier without issue. Plan for suppository tomorrow if ongoing issue as this is going to be the main barrier to discharge along with the weather. He can have on of friends pick him up if the roads clear.     For questions or updates, please contact [facility] Please consult [email or web address] for contact info under   Signed, Donnice DELENA Primus, MD  01/14/2025 12:00 PM     [1] No Known Allergies  "

## 2025-01-14 NOTE — Plan of Care (Signed)
" °  Problem: Clinical Measurements: Goal: Respiratory complications will improve Outcome: Progressing Goal: Cardiovascular complication will be avoided Outcome: Progressing   Problem: Safety: Goal: Ability to remain free from injury will improve Outcome: Progressing   Problem: Skin Integrity: Goal: Risk for impaired skin integrity will decrease Outcome: Progressing   Problem: Cardiovascular: Goal: Ability to achieve and maintain adequate cardiovascular perfusion will improve Outcome: Progressing   "

## 2025-01-15 LAB — BASIC METABOLIC PANEL WITH GFR
Anion gap: 12 (ref 5–15)
BUN: 15 mg/dL (ref 6–20)
CO2: 23 mmol/L (ref 22–32)
Calcium: 8.9 mg/dL (ref 8.9–10.3)
Chloride: 102 mmol/L (ref 98–111)
Creatinine, Ser: 0.75 mg/dL (ref 0.61–1.24)
GFR, Estimated: >60 mL/min
Glucose, Bld: 113 mg/dL — ABNORMAL HIGH (ref 70–99)
Potassium: 3.6 mmol/L (ref 3.5–5.1)
Sodium: 138 mmol/L (ref 135–145)

## 2025-01-15 LAB — CBC WITH DIFFERENTIAL/PLATELET
Abs Immature Granulocytes: 0.06 10*3/uL (ref 0.00–0.07)
Basophils Absolute: 0 10*3/uL (ref 0.0–0.1)
Basophils Relative: 0 %
Eosinophils Absolute: 0.2 10*3/uL (ref 0.0–0.5)
Eosinophils Relative: 1 %
HCT: 36.3 % — ABNORMAL LOW (ref 39.0–52.0)
Hemoglobin: 13.1 g/dL (ref 13.0–17.0)
Immature Granulocytes: 1 %
Lymphocytes Relative: 25 %
Lymphs Abs: 2.8 10*3/uL (ref 0.7–4.0)
MCH: 33.8 pg (ref 26.0–34.0)
MCHC: 36.1 g/dL — ABNORMAL HIGH (ref 30.0–36.0)
MCV: 93.6 fL (ref 80.0–100.0)
Monocytes Absolute: 0.8 10*3/uL (ref 0.1–1.0)
Monocytes Relative: 7 %
Neutro Abs: 7.7 10*3/uL (ref 1.7–7.7)
Neutrophils Relative %: 66 %
Platelets: 280 10*3/uL (ref 150–400)
RBC: 3.88 MIL/uL — ABNORMAL LOW (ref 4.22–5.81)
RDW: 11.3 % — ABNORMAL LOW (ref 11.5–15.5)
WBC: 11.6 10*3/uL — ABNORMAL HIGH (ref 4.0–10.5)
nRBC: 0 % (ref 0.0–0.2)

## 2025-01-15 MED ORDER — POTASSIUM CHLORIDE CRYS ER 20 MEQ PO TBCR
20.0000 meq | EXTENDED_RELEASE_TABLET | Freq: Once | ORAL | Status: AC
  Administered 2025-01-15: 20 meq via ORAL
  Filled 2025-01-15: qty 1

## 2025-01-15 NOTE — Progress Notes (Signed)
 "  Progress Note  Patient Name: Aaron [name] Date of Encounter: 01/15/2025 [facility] Cardiologist: Arun K Thukkani, MD   Interval Summary   No chest pain or shortness of breath.  Had a bowel movement yesterday.  Hematoma still painful to touch but seems to be improving.    Vital Signs Vitals:   01/14/25 0935 01/14/25 1924 01/15/25 0143 01/15/25 0517  BP: 116/74 124/82 116/68 130/79  Pulse: 96     Resp: 18  16 18   Temp: 98.7 F (37.1 C)  98.4 F (36.9 C) (!) 97.5 F (36.4 C)  TempSrc: Oral  Oral Oral  SpO2: 96%  98% 100%  Weight:      Height:        Intake/Output Summary (Last 24 hours) at 01/15/2025 0759 Last data filed at 01/15/2025 0521 Gross per 24 hour  Intake --  Output 550 ml  Net -550 ml      01/11/2025    8:35 PM 01/10/2025    2:00 PM 05/05/2021   10:31 AM  Last 3 Weights  Weight (lbs) 180 lb 180 lb 205 lb  Weight (kg) 81.647 kg 81.647 kg 92.987 kg      Telemetry/ECG  Sinus rhythm heart rate 8200- Personally Reviewed  Physical Exam  GEN: No acute distress.   Neck: No JVD Cardiac: RRR, 2 out of 6 murmur. Respiratory: Clear to auscultation bilaterally. GI: Soft, nontender, non-distended  MS: No edema  Scrotal swelling.  Mild ecchymosis around right groin site.  Patient Profile Patient with past medical history significant for nicotine dependence, recent tooth infection.  Admitted for anterolateral STEMI.  Course complicated by right groin hematoma plus scrotal swelling.  Assessment & Plan   Anterolateral STEMI Presented with chest pain with radiation down his left arm, diaphoretic and clammy.  Underwent emergent cardiac catheterization 1/21 with 100% thrombotic occluded mid LAD treated with PCI/DES x 1.  No further complaints of chest pain.  Right groin hematoma has been his barrier to discharge. Continue with DAPT therapy with aspirin  Brilinta  for at least 1 year. Continue atorvastatin  80 mg. LDL this admission 82.  Goal less than 55.   Will need repeat LFTs and lipid panel 6 to 8 weeks. Ensure he has as needed nitroglycerin  at discharge.  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  Normal RV.  Aortic valve sclerosis noted.  He is well compensated, euvolemic on exam. GDMT: Continue losartan  25 mg, XL 25 mg Check echocardiogram in 2 to 3 months. Reluctant to add on anything currently as he is overwhelmed with all of these new medications.  PTA was on nothing so this is a big change for him.  Right groin hematoma/scrotal swelling CT abdomen pelvis with no evidence for retroperitoneal hemorrhage, 6.5 x 4.7 x 11 cm right groin/inguinal canal hematoma with edema.  Site seems stable and improving but continues to have pain and soreness.  He is adamant about not discharging until this is completely resolved. Pain being managed by Tylenol  500 mg 4 times daily.  Right renal nodule Noted on CT.  Likely benign.  Follow-up with PCP.  Leukocytosis Likely reactive.  No constitutional symptoms.  But given degree of increased 18.2 without trend will repeat CBC with differential here.  Unfortunately no CBCs in the past 4 days.  Constipation Resolved.  I think patient likely could discharge however still very much overwhelmed with current hospitalization and pain still is a barrier.  Site looks okay and overall improving so will defer to MD plans.      For questions or updates, please contact [facility] Please consult [email or web address] for contact info under       Signed, Thom LITTIE Sluder, PA-C    "

## 2025-01-15 NOTE — TOC Initial Note (Addendum)
 Transition of Care [facility]) - Initial/Assessment Note    Patient Details  Name: Aaron [name] MRN: [id number] Date of Birth: [birth year]  Transition of Care [facility]) CM/SW Contact:    Sudie Erminio Deems, RN Phone Number: 01/15/2025, 11:58 AM  Clinical Narrative: Patient post stent 01-11-25-per notes, patient has continued discomfort at the cath site. PTA patient was from home alone. Staff RN states patient has been up with mobility- RN is aware that if the patient needs PT/OT to have orders placed in EPIC. ICM will continue to follow for disposition needs as the patient progresses.             1208 MD to place PT/OT consult for orders. Cardiac Rehab states patient will benefit from a rolling walker.  Expected Discharge Plan: Home/Self Care Barriers to Discharge: No Barriers Identified   Patient Goals and CMS Choice Patient states their goals for this hospitalization and ongoing recovery are:: plans to retun home after pain is controlled.   Expected Discharge Plan and Services In-house Referral: NA Discharge Planning Services: CM Consult Post Acute Care Choice: NA                     DME Agency: NA                  Prior Living Arrangements/Services   Lives with:: Self Patient language and need for interpreter reviewed:: Yes Do you feel safe going back to the place where you live?: Yes        Care giver support system in place?: No (comment)   Criminal Activity/Legal Involvement Pertinent to Current Situation/Hospitalization: No - Comment as needed  Activities of Daily Living   ADL Screening (condition at time of admission) Independently performs ADLs?: No Does the patient have a NEW difficulty with bathing/dressing/toileting/self-feeding that is expected to last >3 days?: No Does the patient have a NEW difficulty with getting in/out of bed, walking, or climbing stairs that is expected to last >3 days?: No Does the patient have a NEW difficulty with  communication that is expected to last >3 days?: No Is the patient deaf or have difficulty hearing?: No Does the patient have difficulty seeing, even when wearing glasses/contacts?: No Does the patient have difficulty concentrating, remembering, or making decisions?: No  Permission Sought/Granted Permission sought to share information with : Case Manager, Family Supports                Emotional Assessment   Attitude/Demeanor/Rapport: Engaged Affect (typically observed): Appropriate Orientation: : Oriented to Self, Oriented to Place Alcohol / Substance Use: Not Applicable Psych Involvement: No (comment)  Admission diagnosis:  STEMI involving left anterior descending coronary artery (HCC) [I21.02] Patient Active Problem List   Diagnosis Date Noted   STEMI involving left anterior descending coronary artery (HCC) 01/10/2025   Pain in unspecified shoulder 08/01/2018   BACK PAIN, LUMBAR, WITH RADICULOPATHY 05/02/2009   PCP:  Patient, No Pcp Per Pharmacy:   [id number] [place], [place] - [address] [place] [address] Phone: [phone] Fax: [phone]  [id number] - [place], [place] - [address] [facility] [address] [place] [address] Phone: [phone] Fax: [phone]     Social Drivers of Health (SDOH) Social History: SDOH Screenings   Food Insecurity: No Food Insecurity (01/11/2025)  Housing: Low Risk (01/11/2025)  Transportation Needs: No Transportation Needs (01/11/2025)  Utilities: Not At Risk (01/11/2025)  Tobacco Use:  High Risk (01/11/2025)   SDOH Interventions:     Readmission Risk Interventions     No data to display

## 2025-01-15 NOTE — Progress Notes (Signed)
 CARDIAC REHAB PHASE I   PRE:  Rate/Rhythm: 86 SR, 124 ST getting out of bed    BP: sitting 130/82    SpO2: 97 RA  MODE:  Ambulation: 270 ft   POST:  Rate/Rhythm: 118 ST    BP: sitting 123/92     SpO2:    Pt needed max assist to be pulled to standing from semi-reclining position in bed due to his groin pain. Once up he ambulated with RW and standby assist. Slow pace. Would not tell me if groin pain was better or worse while ambulating. To recliner after walk. Would benefit from RW for home due to pain. Reviewed antiplatelet importance, walking at home, CRPII. Encouraged more ambulation today.  [id number]  Aliene Aris BS, ACSM-CEP 01/15/2025 12:04 PM

## 2025-01-15 NOTE — Plan of Care (Signed)

## 2025-01-16 LAB — CBC WITH DIFFERENTIAL/PLATELET
Abs Immature Granulocytes: 0.05 10*3/uL (ref 0.00–0.07)
Basophils Absolute: 0 10*3/uL (ref 0.0–0.1)
Basophils Relative: 0 %
Eosinophils Absolute: 0.2 10*3/uL (ref 0.0–0.5)
Eosinophils Relative: 2 %
HCT: 35.6 % — ABNORMAL LOW (ref 39.0–52.0)
Hemoglobin: 12.8 g/dL — ABNORMAL LOW (ref 13.0–17.0)
Immature Granulocytes: 0 %
Lymphocytes Relative: 25 %
Lymphs Abs: 3.2 10*3/uL (ref 0.7–4.0)
MCH: 34.1 pg — ABNORMAL HIGH (ref 26.0–34.0)
MCHC: 36 g/dL (ref 30.0–36.0)
MCV: 94.9 fL (ref 80.0–100.0)
Monocytes Absolute: 1.2 10*3/uL — ABNORMAL HIGH (ref 0.1–1.0)
Monocytes Relative: 10 %
Neutro Abs: 8 10*3/uL — ABNORMAL HIGH (ref 1.7–7.7)
Neutrophils Relative %: 63 %
Platelets: 282 10*3/uL (ref 150–400)
RBC: 3.75 MIL/uL — ABNORMAL LOW (ref 4.22–5.81)
RDW: 10.9 % — ABNORMAL LOW (ref 11.5–15.5)
WBC: 12.7 10*3/uL — ABNORMAL HIGH (ref 4.0–10.5)
nRBC: 0 % (ref 0.0–0.2)

## 2025-01-16 LAB — BASIC METABOLIC PANEL WITH GFR
Anion gap: 11 (ref 5–15)
BUN: 13 mg/dL (ref 6–20)
CO2: 24 mmol/L (ref 22–32)
Calcium: 9.1 mg/dL (ref 8.9–10.3)
Chloride: 104 mmol/L (ref 98–111)
Creatinine, Ser: 0.74 mg/dL (ref 0.61–1.24)
GFR, Estimated: >60 mL/min
Glucose, Bld: 111 mg/dL — ABNORMAL HIGH (ref 70–99)
Potassium: 3.6 mmol/L (ref 3.5–5.1)
Sodium: 139 mmol/L (ref 135–145)

## 2025-01-16 MED ORDER — POTASSIUM CHLORIDE CRYS ER 20 MEQ PO TBCR
40.0000 meq | EXTENDED_RELEASE_TABLET | Freq: Once | ORAL | Status: AC
  Administered 2025-01-16: 40 meq via ORAL
  Filled 2025-01-16: qty 2

## 2025-01-16 NOTE — Progress Notes (Addendum)
 "  Progress Note  Patient Name: Aaron [name] Date of Encounter: 01/16/2025 [facility] Cardiologist: Arun K Thukkani, MD   Interval Summary    No chest pain or shortness of breath.  Continues to report pain at the groin side as well as scrotal swelling.  Scrotal swelling improving.  Still has persistent pain.  Having mobility issues now, PT/OT referral placed yesterday.  Ambulated today with walker had orthostatic episode and fell sweaty and nauseous for a little bit.  No complaints currently.  Vital Signs Vitals:   01/15/25 2046 01/15/25 2325 01/16/25 0428 01/16/25 0803  BP: 121/81 116/79 94/69 101/73  Pulse: 92 99 89 (!) 110  Resp: 18 16 18 20   Temp: 98 F (36.7 C) 98.4 F (36.9 C) 98.2 F (36.8 C) 97.7 F (36.5 C)  TempSrc: Oral Oral Oral Oral  SpO2: 98% 96% 96%   Weight:      Height:        Intake/Output Summary (Last 24 hours) at 01/16/2025 0854 Last data filed at 01/16/2025 0431 Gross per 24 hour  Intake 300 ml  Output 1225 ml  Net -925 ml      01/11/2025    8:35 PM 01/10/2025    2:00 PM 05/05/2021   10:31 AM  Last 3 Weights  Weight (lbs) 180 lb 180 lb 205 lb  Weight (kg) 81.647 kg 81.647 kg 92.987 kg      Telemetry/ECG  Sinus rhythm heart rate 80-100- Personally Reviewed  Physical Exam  GEN: No acute distress.   Neck: No JVD Cardiac: RRR, 2 out of 6 murmur. Respiratory: Clear to auscultation bilaterally. GI: Soft, nontender, non-distended  MS: No edema  Scrotal swelling.  Mild ecchymosis around right groin site.  No hematoma.  Patient Profile Patient with past medical history significant for nicotine dependence, recent tooth infection.  Admitted for anterolateral STEMI.  Course complicated by right groin hematoma plus scrotal swelling.  Assessment & Plan   There have been no significant changes since yesterday.  Discharge has been prohibitive due to scrotal swelling/pain at the groin site.  Additionally patient now with poor mobility but  PT/OT has been consulted.  Patient still would like to defer discharge.  Anterolateral STEMI Presented with chest pain with radiation down his left arm, diaphoretic and clammy.  Underwent emergent cardiac catheterization 1/21 with 100% thrombotic occluded mid LAD treated with PCI/DES x 1.  No further complaints of chest pain.  Right groin hematoma has been his barrier to discharge. Continue with DAPT therapy with aspirin  Brilinta  for at least 1 year. Continue atorvastatin  80 mg. LDL this admission 82.  Goal less than 55.  Will need repeat LFTs and lipid panel 6 to 8 weeks. Ensure he has as needed nitroglycerin  at discharge.  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  Normal RV.  Aortic valve sclerosis noted.  He is well compensated, euvolemic on exam. GDMT: Continue losartan  25 mg, XL 25 mg Check echocardiogram in 2 to 3 months. Reluctant to add on anything currently as he is overwhelmed with all of these new medications.  PTA was on nothing so this is a big change for him.  Right groin hematoma/scrotal swelling CT abdomen pelvis with no evidence for retroperitoneal hemorrhage, 6.5 x 4.7 x 11 cm right groin/inguinal canal hematoma with edema.  Site seems stable and improving but continues to have pain and soreness.  He is adamant about not discharging until this  is completely resolved. Pain being managed by Tylenol  500 mg 4 times daily.  Right renal nodule Noted on CT.  Likely benign.  Follow-up with PCP.  Leukocytosis Likely reactive.  No constitutional symptoms.  WBC downtrending.  No signs of infection.  Constipation Resolved.  Anemia Slight downtrend.  Will need to continue to follow.  No evidence of bleeding.  I think patient likely could discharge however still very much overwhelmed with current hospitalization and pain still is a barrier.  Site looks okay and overall improving so will defer to MD plans.         For questions or updates, please contact [facility] Please consult [email or web address] for contact info under       Signed, Thom LITTIE Sluder, PA-C    "

## 2025-01-16 NOTE — TOC Progression Note (Signed)
 Transition of Care [facility]) - Progression Note    Patient Details  Name: Aaron [name] MRN: [id number] Date of Birth: 1966-02-27  [facility]) CM/SW Contact  Graves-Bigelow, Erminio Deems, RN Phone Number: 01/16/2025, 2:39 PM  Clinical Narrative: Patient was discussed in progression rounds. Patient  had many functional limitations with PT and the recommendations were for SNF.   Patient has declined SNF. Patient has Medicaid and will have limited home visits due to insurance. Patient is agreeable to outpatient PT-patient asked for the [facility]. An ambulatory referral submitted via EPIC. No further home needs identified at this time.   Expected Discharge Plan: OP Rehab Barriers to Discharge: No Barriers Identified  Expected Discharge Plan and Services In-house Referral: Clinical Social Work Discharge Planning Services: CM Consult Post Acute Care Choice: NA Living arrangements for the past 2 months: Single Family Home                   DME Agency: NA    Social Drivers of Health (SDOH) Interventions SDOH Screenings   Food Insecurity: No Food Insecurity (01/11/2025)  Housing: Low Risk (01/11/2025)  Transportation Needs: No Transportation Needs (01/11/2025)  Utilities: Not At Risk (01/11/2025)  Tobacco Use: High Risk (01/11/2025)    Readmission Risk Interventions     No data to display

## 2025-01-16 NOTE — Progress Notes (Signed)
 Nutrition Education Note  RD consulted for nutrition education regarding heart healthy diet.  Attempted to reach patient on phone but no answer. Will re-attempt at a later time as able.  RD provided Low Sodium Nutrition Therapy and Heart Healthy handout from the Academy of [employer] and Dietetics in [place]. Provided examples on ways to decrease sodium intake in diet. Discourages intake of processed foods and use of salt shaker. Encourages fresh fruits and vegetables as well as whole grain sources of carbohydrates to maximize fiber intake.   Body mass index is 24.41 kg/m. Pt meets criteria for normal based on current BMI.  Current diet order is heart healthy, no PO documented. Labs and medications reviewed. No further nutrition interventions warranted at this time.  If additional nutrition issues arise, please re-consult RD.   Morna Lee, MS, RD, LDN Inpatient Clinical Dietitian Contact via Secure chat

## 2025-01-16 NOTE — TOC Progression Note (Addendum)
 Transition of Care [facility]) - Progression Note    Patient Details  Name: Aaron [name] MRN: [id number] Date of Birth: 24-Aug-1966  Transition of Care [facility]) CM/SW Contact  Isaiah Public, LCSWA Phone Number: 01/16/2025, 2:38 PM  Clinical Narrative:     CSW received consult for possible SNF placement at time of discharge. CSW spoke with patient at bedside regarding PT recommendation of SNF placement at time of discharge. Patient reports PTA he comes from home alone.Patient expressed understanding of PT recommendation and politely declined SNF placement at time of discharge. Patient reports preference for outpatient PT. CSW informed CM. Patient request resources for disability. CSW provided patient with resources for Disability for [place] and [employer]. Patient request for his Goddaughter Coree Forte tele# [phone] to be added to his face sheet as first contact and is Aunt as second contact. CSW added patients Goddaughter to patients contacts as requested. All questions answered. No further questions reported at this time.CSW to continue to follow and assist with discharge planning needs.   Expected Discharge Plan: Home/Self Care Barriers to Discharge: Continued Medical Work up               Expected Discharge Plan and Services In-house Referral: Clinical Social Work Discharge Planning Services: CM Consult Post Acute Care Choice: NA Living arrangements for the past 2 months: Single Family Home                   DME Agency: NA                   Social Drivers of Health (SDOH) Interventions SDOH Screenings   Food Insecurity: No Food Insecurity (01/11/2025)  Housing: Low Risk (01/11/2025)  Transportation Needs: No Transportation Needs (01/11/2025)  Utilities: Not At Risk (01/11/2025)  Tobacco Use: High Risk (01/11/2025)    Readmission Risk Interventions     No data to display

## 2025-01-16 NOTE — Evaluation (Signed)
 Physical Therapy Evaluation Patient Details Name: Aaron [name] MRN: [id number] DOB: 16-Feb-1966 Today's Date: 01/16/2025  History of Present Illness  Pt is a 59 y.o. male who presented 01/10/25 due to chest pain/STEMI.Pt s/p cardia cath 1/21.  PMH: hemorrhoid, recent tooth infection  Clinical Impression  Pt admitted with above diagnosis. PTA pt lived alone, independent. Pt currently with functional limitations due to the deficits listed below (see PT Problem List). On eval, pt required min assist bed mobility, mod assist transfers, and supervision amb 15' with RW. Gait distance limited by pain. HR into 140s. Pt will benefit from acute skilled PT to increase their independence and safety with mobility to allow discharge. Post acute, pt would benefit from further therapy in inpatient setting < 3 hours/day.         If plan is discharge home, recommend the following: A lot of help with walking and/or transfers;A lot of help with bathing/dressing/bathroom;Assistance with cooking/housework;Help with stairs or ramp for entrance;Assist for transportation   Can travel by private vehicle   Yes    Equipment Recommendations Rolling walker (2 wheels)  Recommendations for Other Services       Functional Status Assessment Patient has had a recent decline in their functional status and demonstrates the ability to make significant improvements in function in a reasonable and predictable amount of time.     Precautions / Restrictions Precautions Precautions: Fall;Other (comment) Recall of Precautions/Restrictions: Intact Precaution/Restrictions Comments: watch HR Restrictions Weight Bearing Restrictions Per Provider Order: No      Mobility  Bed Mobility Overal bed mobility: Needs Assistance Bed Mobility: Sit to Supine     Supine to sit: Min assist, HOB elevated, Used rails     General bed mobility comments: needs to move slowly due to pain    Transfers Overall transfer level: Needs  assistance Equipment used: Rolling walker (2 wheels) Transfers: Sit to/from Stand Sit to Stand: Mod assist           General transfer comment: Difficult time powering up due to scrotal and R groin pain limiting hip flexion. From recliner pt able to pull up, using RW placed sideways in front of him for leverage. Therapist bracing RW.    Ambulation/Gait Ambulation/Gait assistance: Supervision Gait Distance (Feet): 15 Feet Assistive device: Rolling walker (2 wheels) Gait Pattern/deviations: Step-through pattern, Decreased stride length, Wide base of support, Trunk flexed Gait velocity: decreased     General Gait Details: distance limited by pain. HR into 140s  Stairs            Wheelchair Mobility     Tilt Bed    Modified Rankin (Stroke Patients Only)       Balance Overall balance assessment: Needs assistance Sitting-balance support: Feet supported, No upper extremity supported Sitting balance-Leahy Scale: Good     Standing balance support: Bilateral upper extremity supported, Reliant on assistive device for balance, During functional activity Standing balance-Leahy Scale: Poor                               Pertinent Vitals/Pain Pain Assessment Pain Assessment: 0-10 Pain Score: 10-Worst pain ever Pain Location: scrotum, R groin Pain Descriptors / Indicators: Grimacing, Discomfort, Guarding, Tender Pain Intervention(s): Limited activity within patient's tolerance, Monitored during session, Repositioned, Ice applied    Home Living Family/patient expects to be discharged to:: Private residence Living Arrangements: Alone   Type of Home: House Home Access: Stairs to enter Entrance Stairs-Rails: Right;Left;Can reach  both Entrance Stairs-Number of Steps: 3   Home Layout: One level Home Equipment: None      Prior Function Prior Level of Function : Independent/Modified Independent                     Extremity/Trunk Assessment    Upper Extremity Assessment Upper Extremity Assessment: Overall WFL for tasks assessed    Lower Extremity Assessment Lower Extremity Assessment: Generalized weakness    Cervical / Trunk Assessment Cervical / Trunk Assessment: Normal  Communication   Communication Communication: No apparent difficulties    Cognition Arousal: Alert Behavior During Therapy: WFL for tasks assessed/performed, Flat affect   PT - Cognitive impairments: No apparent impairments                         Following commands: Intact       Cueing Cueing Techniques: Verbal cues     General Comments      Exercises     Assessment/Plan    PT Assessment Patient needs continued PT services  PT Problem List Decreased strength;Decreased mobility;Decreased activity tolerance;Decreased balance;Decreased knowledge of use of DME;Pain       PT Treatment Interventions DME instruction;Therapeutic exercise;Gait training;Balance training;Stair training;Functional mobility training;Therapeutic activities;Patient/family education    PT Goals (Current goals can be found in the Care Plan section)  Acute Rehab PT Goals Patient Stated Goal: decrease pain PT Goal Formulation: With patient Time For Goal Achievement: 01/30/25 Potential to Achieve Goals: Good    Frequency Min 2X/week     Co-evaluation               AM-PAC PT 6 Clicks Mobility  Outcome Measure Help needed turning from your back to your side while in a flat bed without using bedrails?: A Little Help needed moving from lying on your back to sitting on the side of a flat bed without using bedrails?: A Little Help needed moving to and from a bed to a chair (including a wheelchair)?: A Lot Help needed standing up from a chair using your arms (e.g., wheelchair or bedside chair)?: A Lot Help needed to walk in hospital room?: A Little Help needed climbing 3-5 steps with a railing? : Total 6 Click Score: 14    End of Session Equipment  Utilized During Treatment: Gait belt Activity Tolerance: Patient limited by pain Patient left: in bed;with call bell/phone within reach Nurse Communication: Mobility status PT Visit Diagnosis: Pain;Difficulty in walking, not elsewhere classified (R26.2);Muscle weakness (generalized) (M62.81)    Time: 8982-8962 PT Time Calculation (min) (ACUTE ONLY): 20 min   Charges:   PT Evaluation $PT Eval Moderate Complexity: 1 Mod   PT General Charges $$ ACUTE PT VISIT: 1 Visit         Sari MATSU., PT  Office # [phone]   Erven Sari Shaker 01/16/2025, 11:41 AM

## 2025-01-16 NOTE — Evaluation (Signed)
 Occupational Therapy Evaluation Patient Details Name: Aaron [name] MRN: [id number] DOB: 04-06-1966 Today's Date: 01/16/2025   History of Present Illness   Pt is a 60 y.o. male who presented 121/26 due to chest pain/STEMI.Pt s/p cardia cath 1/21.  PMH: hemorrhoid, recent tooth infection     Clinical Impressions Pt reported at PLOF they live alone and were indep. He lives in single level home with 3STE with B rails to enter. At this time required heavy mod/max assist for supine to stand due to pain. Pt then required RW  with CGA to assist with ambulation around the bed to chair. Pt noted to have increase in HR to 143. Once in a sitting position reported they were starting to feel like they were going to start to vomit (never did in session). Pt's BP was 101/73 (82) and HR was 107. Nursing aware. At this time from continued inpatient follow up therapy, <3 hours/day but may progress to [facility] level.       If plan is discharge home, recommend the following:   A lot of help with walking and/or transfers;A lot of help with bathing/dressing/bathroom;Assistance with cooking/housework;Assist for transportation;Help with stairs or ramp for entrance     Functional Status Assessment   Patient has had a recent decline in their functional status and demonstrates the ability to make significant improvements in function in a reasonable and predictable amount of time.     Equipment Recommendations    (RW)     Recommendations for Other Services         Precautions/Restrictions   Precautions Precautions: Fall Recall of Precautions/Restrictions: Intact Precaution/Restrictions Comments: watch HR Restrictions Weight Bearing Restrictions Per Provider Order: No     Mobility Bed Mobility Overal bed mobility: Needs Assistance Bed Mobility: Supine to Sit     Supine to sit: Mod assist, Max assist, HOB elevated, Used rails     General bed mobility comments: Pt pulled self up into long  sitting but then required heavy mod-max assist to EOB and end up exiting bed backwards due to pain in groin area then given RW to prop self up onto to ambulate    Transfers Overall transfer level: Needs assistance Equipment used: Rolling walker (2 wheels) Transfers: Sit to/from Stand Sit to Stand: Mod assist           General transfer comment: see bed mob      Balance Overall balance assessment: Needs assistance Sitting-balance support: Feet supported, Bilateral upper extremity supported Sitting balance-Leahy Scale: Fair Sitting balance - Comments: lateral lean to off load pain Postural control: Posterior lean, Right lateral lean, Left lateral lean Standing balance support: Bilateral upper extremity supported Standing balance-Leahy Scale: Poor Standing balance comment: reliant on RW due to pain                           ADL either performed or assessed with clinical judgement   ADL Overall ADL's : Needs assistance/impaired Eating/Feeding: Independent;Sitting   Grooming: Set up;Sitting   Upper Body Bathing: Sitting;Set up   Lower Body Bathing: Maximal assistance;Sit to/from stand;Sitting/lateral leans   Upper Body Dressing : Set up;Sitting   Lower Body Dressing: Maximal assistance;Sit to/from stand;Sitting/lateral leans                       Vision         Perception         Praxis  Pertinent Vitals/Pain Pain Assessment Pain Assessment: 0-10 Pain Score: 6  Pain Location: Pt reported in the beginging 6 pain and asked after attempting ambulation but did not report Pain Descriptors / Indicators: Grimacing, Discomfort Pain Intervention(s): Limited activity within patient's tolerance, Monitored during session, Repositioned, Ice applied     Extremity/Trunk Assessment Upper Extremity Assessment Upper Extremity Assessment: Overall WFL for tasks assessed   Lower Extremity Assessment Lower Extremity Assessment: Defer to PT  evaluation   Cervical / Trunk Assessment Cervical / Trunk Assessment: Normal   Communication Communication Communication: No apparent difficulties   Cognition Arousal: Alert Behavior During Therapy: WFL for tasks assessed/performed Cognition: No apparent impairments                               Following commands: Intact       Cueing  General Comments   Cueing Techniques: Verbal cues      Exercises     Shoulder Instructions      Home Living Family/patient expects to be discharged to:: Private residence Living Arrangements: Alone   Type of Home: House Home Access: Stairs to enter Secretary/administrator of Steps: 3 Entrance Stairs-Rails: Right;Left;Can reach both Home Layout: One level     Bathroom Shower/Tub: Runner, Broadcasting/film/video: None          Prior Functioning/Environment Prior Level of Function : Independent/Modified Independent                    OT Problem List: Decreased strength;Decreased activity tolerance;Impaired balance (sitting and/or standing);Decreased range of motion;Decreased safety awareness;Decreased knowledge of use of DME or AE;Pain   OT Treatment/Interventions: Self-care/ADL training;Therapeutic exercise;DME and/or AE instruction;Therapeutic activities;Patient/family education;Balance training      OT Goals(Current goals can be found in the care plan section)   Acute Rehab OT Goals Patient Stated Goal: to feel better OT Goal Formulation: With patient Time For Goal Achievement: 01/30/25 Potential to Achieve Goals: Fair ADL Goals Pt Will Perform Lower Body Bathing: with modified independence;sit to/from stand;sitting/lateral leans Pt Will Perform Lower Body Dressing: with modified independence;sitting/lateral leans;sit to/from stand;with adaptive equipment Pt Will Transfer to Toilet: with modified independence;ambulating Pt Will Perform Toileting - Clothing Manipulation and hygiene: with  modified independence;sit to/from stand;sitting/lateral leans Additional ADL Goal #1: Pt will be able to tolerate to complete 2 ADLs tasks to prepare to return home.   OT Frequency:  Min 2X/week    Co-evaluation              AM-PAC OT 6 Clicks Daily Activity     Outcome Measure Help from another person eating meals?: None Help from another person taking care of personal grooming?: A Little Help from another person toileting, which includes using toliet, bedpan, or urinal?: A Lot Help from another person bathing (including washing, rinsing, drying)?: A Lot Help from another person to put on and taking off regular upper body clothing?: A Little Help from another person to put on and taking off regular lower body clothing?: A Lot 6 Click Score: 16   End of Session Equipment Utilized During Treatment: Gait belt;Rolling walker (2 wheels) Nurse Communication: Mobility status  Activity Tolerance: No increased pain (HR) Patient left: in chair;with call bell/phone within reach  OT Visit Diagnosis: Unsteadiness on feet (R26.81);Muscle weakness (generalized) (M62.81);Pain Pain - part of body:  (groin)  Time: 9255-9186 OT Time Calculation (min): 29 min Charges:  OT General Charges $OT Visit: 1 Visit OT Evaluation $OT Eval Low Complexity: 1 Low OT Treatments $Self Care/Home Management : 8-22 mins  Warrick POUR OTR/L  Acute Rehab Services  [phone] office number   Warrick Berber 01/16/2025, 8:27 AM

## 2025-01-17 ENCOUNTER — Inpatient Hospital Stay (HOSPITAL_COMMUNITY)

## 2025-01-17 MED ORDER — OXYCODONE HCL 5 MG PO TABS
5.0000 mg | ORAL_TABLET | Freq: Once | ORAL | Status: AC
  Administered 2025-01-17: 5 mg via ORAL
  Filled 2025-01-17: qty 1

## 2025-01-17 MED ORDER — IOHEXOL 350 MG/ML SOLN
75.0000 mL | Freq: Once | INTRAVENOUS | Status: AC | PRN
  Administered 2025-01-17: 75 mL via INTRAVENOUS

## 2025-01-17 MED ORDER — METOPROLOL SUCCINATE ER 50 MG PO TB24
50.0000 mg | ORAL_TABLET | Freq: Every day | ORAL | Status: DC
  Administered 2025-01-17 – 2025-01-24 (×8): 50 mg via ORAL
  Filled 2025-01-17 (×8): qty 1

## 2025-01-17 MED ORDER — FENTANYL CITRATE (PF) 50 MCG/ML IJ SOSY
25.0000 ug | PREFILLED_SYRINGE | Freq: Once | INTRAMUSCULAR | Status: AC
  Administered 2025-01-17: 25 ug via INTRAVENOUS
  Filled 2025-01-17: qty 1

## 2025-01-17 MED ORDER — APIXABAN 5 MG PO TABS
10.0000 mg | ORAL_TABLET | Freq: Two times a day (BID) | ORAL | Status: AC
  Administered 2025-01-17 – 2025-01-24 (×14): 10 mg via ORAL
  Filled 2025-01-17 (×14): qty 2

## 2025-01-17 MED ORDER — CLOPIDOGREL BISULFATE 75 MG PO TABS
300.0000 mg | ORAL_TABLET | Freq: Once | ORAL | Status: AC
  Administered 2025-01-18: 300 mg via ORAL
  Filled 2025-01-17: qty 4

## 2025-01-17 MED ORDER — HYDROCODONE-ACETAMINOPHEN 5-325 MG PO TABS
1.0000 | ORAL_TABLET | ORAL | Status: DC | PRN
  Administered 2025-01-17 – 2025-01-20 (×6): 1 via ORAL
  Filled 2025-01-17 (×6): qty 1

## 2025-01-17 MED ORDER — APIXABAN 5 MG PO TABS
5.0000 mg | ORAL_TABLET | Freq: Two times a day (BID) | ORAL | Status: DC
  Administered 2025-01-24 – 2025-01-25 (×2): 5 mg via ORAL
  Filled 2025-01-17 (×2): qty 1

## 2025-01-17 NOTE — Plan of Care (Signed)
" °  Problem: Education: Goal: Knowledge of General Education information will improve Description: Including pain rating scale, medication(s)/side effects and non-pharmacologic comfort measures Outcome: Progressing   Problem: Clinical Measurements: Goal: Ability to maintain clinical measurements within normal limits will improve Outcome: Progressing   Problem: Clinical Measurements: Goal: Will remain free from infection Outcome: Progressing   Problem: Clinical Measurements: Goal: Diagnostic test results will improve Outcome: Progressing   Problem: Clinical Measurements: Goal: Respiratory complications will improve Outcome: Progressing   Problem: Pain Managment: Goal: General experience of comfort will improve and/or be controlled Outcome: Progressing   Problem: Safety: Goal: Ability to remain free from injury will improve Outcome: Progressing   Problem: Skin Integrity: Goal: Risk for impaired skin integrity will decrease Outcome: Progressing   Problem: Activity: Goal: Ability to return to baseline activity level will improve Outcome: Progressing   "

## 2025-01-17 NOTE — Progress Notes (Addendum)
 Physical Therapy Treatment Patient Details Name: Aaron [name] MRN: [id number] DOB: 1965/12/29 Today's Date: 01/17/2025   History of Present Illness Pt is a 59 y.o. male who presented 121/26 due to chest pain/STEMI. 1/21 cardiac catheterization. Pt also with R groin/inguinal canal hematoma w/ edema. PMH: hemorrhoid, recent tooth infection    PT Comments  Pt received in supine with cousin present and agreeable to PT session. Mobility limited 2/2 significant pain in scrotum/R groin despite pre-medication as well as tachycardia. Pt required MinA for bed mobility and CGA for seated balance. Pt tended to have a R lateral trunk lean to offload pain. Able to perform squat-pivot to the recliner with pt reaching for opposite handrail and CGA for safety. Increased time needed in between exercises and movement. Continue to recommend <3hrs post acute rehab as pt is needing assist for all mobility and only has intermittent assist available upon d/c home. If pt continues to decline, pt would need 24/7 assist and HHPT. Will continue to follow acutely.  At rest- 135-140 BPM During mobility- 140-160 BPM After mobility- 138 BPM    If plan is discharge home, recommend the following: A lot of help with walking and/or transfers;A lot of help with bathing/dressing/bathroom;Assistance with cooking/housework;Help with stairs or ramp for entrance;Assist for transportation   Can travel by private vehicle     No  Equipment Recommendations  Rolling walker (2 wheels);Wheelchair (measurements PT);Wheelchair cushion (measurements PT);BSC/3in1       Precautions / Restrictions Precautions Precautions: Fall;Other (comment) Recall of Precautions/Restrictions: Intact Precaution/Restrictions Comments: watch HR Restrictions Weight Bearing Restrictions Per Provider Order: No     Mobility  Bed Mobility Overal bed mobility: Needs Assistance Bed Mobility: Supine to Sit    Supine to sit: Min assist, HOB elevated, Used  rails    General bed mobility comments: increased time and effort with MinA to scoot hips forward using bed pad    Transfers Overall transfer level: Needs assistance Equipment used: None Transfers: Bed to chair/wheelchair/BSC    Squat pivot transfers: Contact guard assist    General transfer comment: Performed squat-pivot to the right with pt reaching for recliner arm rest. CGA for safety    Ambulation/Gait    General Gait Details: deferred 2/2 pain and tachycardia      Balance Overall balance assessment: Needs assistance Sitting-balance support: Feet supported, No upper extremity supported Sitting balance-Leahy Scale: Fair Sitting balance - Comments: R lateral lean to off load pain Postural control: Right lateral lean Standing balance support: Single extremity supported, During functional activity, Reliant on assistive device for balance Standing balance-Leahy Scale: Poor Standing balance comment: reliant on UE support       Communication Communication Communication: No apparent difficulties  Cognition Arousal: Alert Behavior During Therapy: WFL for tasks assessed/performed, Flat affect   PT - Cognitive impairments: No apparent impairments    Following commands: Intact      Cueing Cueing Techniques: Verbal cues  Exercises General Exercises - Lower Extremity Ankle Circles/Pumps: AROM, Both, 20 reps, Supine Heel Slides: AROM, Both, 5 reps, Supine    General Comments     Cleared for mobility by RN   Pertinent Vitals/Pain Pain Assessment Pain Assessment: Faces Faces Pain Scale: Hurts whole lot Pain Location: scrotum, R groin Pain Descriptors / Indicators: Grimacing, Discomfort, Guarding, Tender Pain Intervention(s): Monitored during session, Premedicated before session, Repositioned, Limited activity within patient's tolerance     PT Goals (current goals can now be found in the care plan section) Acute Rehab PT Goals PT Goal  Formulation: With patient Time  For Goal Achievement: 01/30/25 Potential to Achieve Goals: Good Progress towards PT goals: Progressing toward goals    Frequency    Min 2X/week       AM-PAC PT 6 Clicks Mobility   Outcome Measure  Help needed turning from your back to your side while in a flat bed without using bedrails?: A Little Help needed moving from lying on your back to sitting on the side of a flat bed without using bedrails?: A Little Help needed moving to and from a bed to a chair (including a wheelchair)?: A Little Help needed standing up from a chair using your arms (e.g., wheelchair or bedside chair)?: A Lot Help needed to walk in hospital room?: Total Help needed climbing 3-5 steps with a railing? : Total 6 Click Score: 13    End of Session   Activity Tolerance: Patient limited by pain Patient left: in chair;with call bell/phone within reach Nurse Communication: Mobility status;Patient requests pain meds PT Visit Diagnosis: Pain;Difficulty in walking, not elsewhere classified (R26.2);Muscle weakness (generalized) (M62.81)     Time: 9044-8983 PT Time Calculation (min) (ACUTE ONLY): 21 min  Charges:    $Therapeutic Activity: 8-22 mins PT General Charges $$ ACUTE PT VISIT: 1 Visit                    Aaron [name], PT, DPT Secure Chat Preferred  Rehab Office [phone]   Aaron [name] 01/17/2025, 12:09 PM

## 2025-01-17 NOTE — Progress Notes (Signed)
 CCDM called that pt HR went up to 140s. On getting to the pt room, pt was seen grabbing the bed side rails and groaning in pain. Pt rated the pain a 10/10 on his flank and right groin. Damarcus Ingram, MD was page. A STAT oxycodone  table order was added. I administered the oxy medication to pt. Pt is AOX4.

## 2025-01-17 NOTE — NC FL2 (Signed)
 " [place]  MEDICAID FL2 LEVEL OF CARE FORM     IDENTIFICATION  Patient Name: Aaron [name] Birthdate: Jan 28, 1966 Sex: male Admission Date (Current Location): 01/10/2025  [facility] and [place] Number:  Producer, Television/film/video and Address:  [facility] [place], [address] [place], [place] [address]      Provider Number: [id number]  Attending Physician Name and Address:  Wendel Lurena POUR, MD  Relative Name and Phone Number:  Aldon Pickle [facility]) [phone]    Current Level of Care: Hospital Recommended Level of Care: Skilled Nursing Facility Prior Approval Number:    Date Approved/Denied:   PASRR Number: [id number]  Discharge Plan: SNF    Current Diagnoses: Patient Active Problem List   Diagnosis Date Noted   STEMI involving left anterior descending coronary artery (HCC) 01/10/2025   Pain in unspecified shoulder 08/01/2018   BACK PAIN, LUMBAR, WITH RADICULOPATHY 05/02/2009    Orientation RESPIRATION BLADDER Height & Weight     Self, Time, Situation, Place  Normal Continent Weight: 180 lb (81.6 kg) Height:  6' (182.9 cm)  BEHAVIORAL SYMPTOMS/MOOD NEUROLOGICAL BOWEL NUTRITION STATUS      Continent Diet (Please see discharge summary)  AMBULATORY STATUS COMMUNICATION OF NEEDS Skin   Extensive Assist Verbally Other (Comment) (Ecchymosis,Hand,R,Lower,Erythema,arm,Groin,R,Wound/Incision LDAs)                       Personal Care Assistance Level of Assistance  Bathing, Feeding, Dressing Bathing Assistance: Maximum assistance Feeding assistance: Independent Dressing Assistance: Maximum assistance     Functional Limitations Info  Sight, Hearing, Speech Sight Info: Adequate Hearing Info: Adequate Speech Info: Adequate    SPECIAL CARE FACTORS FREQUENCY  PT (By licensed PT), OT (By licensed OT)     PT Frequency: 5x min weekly OT Frequency: 5x min weekly            Contractures Contractures Info: Not present    Additional  Factors Info  Code Status, Allergies Code Status Info: FULL Allergies Info: NKA           Current Medications (01/17/2025):  This is the current hospital active medication list Current Facility-Administered Medications  Medication Dose Route Frequency Provider Last Rate Last Admin   acetaminophen  (TYLENOL ) tablet 500 mg  500 mg Oral QID Thukkani, Arun K, MD   500 mg at 01/17/25 1442   aspirin  chewable tablet 81 mg  81 mg Oral Daily Thukkani, Arun K, MD   81 mg at 01/17/25 0913   atorvastatin  (LIPITOR) tablet 80 mg  80 mg Oral Daily Thukkani, Arun K, MD   80 mg at 01/17/25 0913   bisacodyl  (DULCOLAX) suppository 10 mg  10 mg Rectal Daily PRN Suleiman, Belal A, MD       HYDROcodone -acetaminophen  (NORCO/VICODIN) 5-325 MG per tablet 1 tablet  1 tablet Oral Q6H PRN Thukkani, Arun K, MD   1 tablet at 01/17/25 0912   losartan  (COZAAR ) tablet 25 mg  25 mg Oral Daily Henry Shaver B, NP   25 mg at 01/17/25 9087   metoprolol  succinate (TOPROL -XL) 24 hr tablet 25 mg  25 mg Oral QHS Almetta Donnice LABOR, MD   25 mg at 01/16/25 2223   nitroGLYCERIN  (NITROSTAT ) SL tablet 0.4 mg  0.4 mg Sublingual Q5 min PRN Thukkani, Arun K, MD       ondansetron  (ZOFRAN ) injection 4 mg  4 mg Intravenous Q6H PRN Thukkani, Arun K, MD   4 mg at 01/11/25 0343   Oral care mouth  rinse  15 mL Mouth Rinse PRN Thukkani, Arun K, MD       polyethylene glycol (MIRALAX  / GLYCOLAX ) packet 17 g  17 g Oral Daily Henry Shaver B, NP   17 g at 01/17/25 0914   senna-docusate (Senokot-S) tablet 1 tablet  1 tablet Oral QHS Henry Shaver B, NP   1 tablet at 01/16/25 2223   sodium chloride  flush (NS) 0.9 % injection 3 mL  3 mL Intravenous Q12H Thukkani, Arun K, MD   3 mL at 01/17/25 0916   sodium chloride  flush (NS) 0.9 % injection 3 mL  3 mL Intravenous PRN Thukkani, Arun K, MD   3 mL at 01/14/25 2225   ticagrelor  (BRILINTA ) tablet 90 mg  90 mg Oral BID Thukkani, Arun K, MD   90 mg at 01/17/25 9087     Discharge Medications: Please  see discharge summary for a list of discharge medications.  Relevant Imaging Results:  Relevant Lab Results:   Additional Information [id number]  Isaiah Public, LCSWA     "

## 2025-01-17 NOTE — Progress Notes (Signed)
 Upon re-assessing pt pain after STAT oxycodone  administration, pt stated that he doesn't feel any pain at this point. Pt is AOX4, in bed, and watching TV at this time. No signs of pain observed.

## 2025-01-17 NOTE — Plan of Care (Signed)

## 2025-01-17 NOTE — TOC Progression Note (Addendum)
 Transition of Care [facility]) - Progression Note    Patient Details  Name: Aaron [name] MRN: [id number] Date of Birth: [birth year]  [facility] of [facility] CM/SW Contact  Isaiah Public, LCSWA Phone Number: 01/17/2025, 4:09 PM  Clinical Narrative:     CSW informed by RN patients Goddaughter Coree wants patient to go to SNF. CSW and with patients permission SW intern [employer] followed up with patient at bedside to confirm his dc plan. CSW informed patient with this information. Patient now agreeable to SNF placement. CSW discussed insurance authorization process. Patient gave CSW permission to fax out for SNF placement. Patient gave CSW permission to update his Goddaughter Coree. CSW updated patients Goddaughter Coree. All questions answered. No further questions reported at this time.  Expected Discharge Plan: OP Rehab Barriers to Discharge: No Barriers Identified               Expected Discharge Plan and Services In-house Referral: Clinical Social Work Discharge Planning Services: CM Consult Post Acute Care Choice: NA Living arrangements for the past 2 months: Single Family Home                   DME Agency: NA                   Social Drivers of Health (SDOH) Interventions SDOH Screenings   Food Insecurity: No Food Insecurity (01/11/2025)  Housing: Low Risk (01/11/2025)  Transportation Needs: No Transportation Needs (01/11/2025)  Utilities: Not At Risk (01/11/2025)  Tobacco Use: High Risk (01/11/2025)    Readmission Risk Interventions     No data to display

## 2025-01-17 NOTE — Progress Notes (Signed)
"  °  Progress Note  Patient Name: Aaron [name] Date of Encounter: 01/17/2025 [facility] Cardiologist: Arun K Thukkani, MD   Interval Summary    Still with right groin pain, improved with oxycodone  this morning. Remains tachycardic in the 120s while at rest, short of breath this morning.   Vital Signs Vitals:   01/16/25 2223 01/17/25 0001 01/17/25 0444 01/17/25 0824  BP: 131/89 124/75 (!) 145/90 (!) 130/91  Pulse: (!) 111 (!) 108 (!) 108 (!) 116  Resp:  18 16 17   Temp:  98.6 F (37 C) 98.2 F (36.8 C) 97.6 F (36.4 C)  TempSrc:  Oral Oral Oral  SpO2:  96% 96%   Weight:      Height:        Intake/Output Summary (Last 24 hours) at 01/17/2025 0859 Last data filed at 01/17/2025 0456 Gross per 24 hour  Intake 480 ml  Output 1125 ml  Net -645 ml      01/11/2025    8:35 PM 01/10/2025    2:00 PM 05/05/2021   10:31 AM  Last 3 Weights  Weight (lbs) 180 lb 180 lb 205 lb  Weight (kg) 81.647 kg 81.647 kg 92.987 kg      Telemetry/ECG   Sinus Tachycardia, rates 120s - Personally Reviewed  Physical Exam  GEN: Short of breath, on  @2L     Neck: No JVD Cardiac: Tachycardia, no murmurs, rubs, or gallops.  Respiratory: Clear to auscultation bilaterally. GI: Soft, nontender, non-distended  MS: Right groin tender with palpation, no significant hematoma noted   Assessment & Plan   59 y.o. male with PMH of hemorrhoid and recent tooth infection who was seen 01/10/2025 for the evaluation of STEMI.   Anterior STEMI -- Underwent cardiac catheterization 1/21 with 100% thrombotic occluded mid LAD treated with PCI/DES x 1.  Recommendations for DAPT with aspirin /Brilinta  for at least 1 year.  No recurrent chest pain.  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  -- Site remains stable though continues to have significant pain at the site and difficulty with ambulation  -- will re-image today to ensure no RP bleed noted  Tachycardic Short of breath -- he is tachycardic with HR in the 120s with rest, was up to the 140s with minimal ambulation yesterday and now short of breath today -- will check CT angio to rule out PE   Right renal nodule -- noted on CT, likely benign. Follow up with PCP   Has been working with PT but minimal improvement, discussed possible placement yesterday but he declines SNF at this time   Medical Readiness Date: 01/18/2025    For questions or updates, please contact [facility] Please consult [email or web address] for contact info under         Signed, Manuelita Rummer, NP  Ct chest "

## 2025-01-17 NOTE — Progress Notes (Signed)
 PHARMACY - ANTICOAGULATION CONSULT NOTE  Pharmacy Consult for Eliquis   Indication: PE  Allergies[1]  Patient Measurements: Height: 6' (182.9 cm) Weight: 81.6 kg (180 lb) IBW/kg (Calculated) : 77.6 HEPARIN  DW (KG): 81.6  Vital Signs: Temp: 98.9 F (37.2 C) (01/28 1708) Temp Source: Oral (01/28 1708) BP: 120/84 (01/28 1708) Pulse Rate: 126 (01/28 1708)  Labs: Recent Labs    01/15/25 0248 01/15/25 0909 01/16/25 0424  HGB  --  13.1 12.8*  HCT  --  36.3* 35.6*  PLT  --  280 282  CREATININE 0.75  --  0.74    Estimated Creatinine Clearance: 111.8 mL/min (by C-G formula based on SCr of 0.74 mg/dL).   Medical History: Past Medical History:  Diagnosis Date   Colon polyps    benign per patient    Medications:  Medications Prior to Admission  Medication Sig Dispense Refill Last Dose/Taking   psyllium (REGULOID) 0.52 g capsule Take 0.52 g by mouth daily.   01/10/2025 Morning   hydrocortisone  (ANUSOL -HC) 2.5 % rectal cream Place 1 Application rectally 2 (two) times daily. (Patient not taking: Reported on 01/10/2025) 30 g 0 Not Taking   lidocaine  (XYLOCAINE ) 2 % solution Use as directed 15 mLs in the mouth or throat as needed (rectal pain). (Patient not taking: Reported on 01/10/2025) 15 mL 0 Not Taking   Scheduled:   acetaminophen   500 mg Oral QID   apixaban   10 mg Oral BID   Followed by   NOREEN ON 01/24/2025] apixaban   5 mg Oral BID   aspirin   81 mg Oral Daily   atorvastatin   80 mg Oral Daily   [START ON 01/18/2025] clopidogrel   300 mg Oral Once   losartan   25 mg Oral Daily   metoprolol  succinate  25 mg Oral QHS   polyethylene glycol  17 g Oral Daily   senna-docusate  1 tablet Oral QHS   sodium chloride  flush  3 mL Intravenous Q12H   ticagrelor   90 mg Oral BID   Infusions:  PRN: bisacodyl , HYDROcodone -acetaminophen , nitroGLYCERIN , ondansetron  (ZOFRAN ) IV, mouth rinse, sodium chloride  flush Anti-infectives (From admission, onward)    None        Assessment: Patient presented with STEMI now s/p LHC and PCI/DES x1. CT chest showed bilateral PE today. Pharmacy consulted to dose Eliquis .   Plan:  Start Eliquis  10 mg BID x 7 days followed by 5 mg BID  Will run copay check and complete education before discharge   Rankin Sams 01/17/2025,5:32 PM      [1] No Known Allergies

## 2025-01-17 NOTE — Progress Notes (Signed)
" ° ° °  CT angio chest confirmed bilateral PEs, no evidence of right heart strain with right inguinal canal hematoma similar or slightly decreased since CT on 1/23.  Reviewed with MD, will continue Brilinta  through this evening with plans to transition to Plavix  in the morning with 300 mg load x 1.  Will obtain limited echocardiogram as well as lower extremity Dopplers to assess for DVT.  Start PE dose Eliquis . Monitor closely with need for triple therapy and groin hematoma. CBC in am.   Signed, Manuelita Rummer, NP-C 01/17/2025, 5:11 PM Pager: [phone]  "

## 2025-01-18 ENCOUNTER — Telehealth (HOSPITAL_COMMUNITY): Payer: Self-pay | Admitting: Pharmacy Technician

## 2025-01-18 ENCOUNTER — Inpatient Hospital Stay (HOSPITAL_COMMUNITY)

## 2025-01-18 ENCOUNTER — Other Ambulatory Visit (HOSPITAL_COMMUNITY): Payer: Self-pay

## 2025-01-18 DIAGNOSIS — Z86711 Personal history of pulmonary embolism: Secondary | ICD-10-CM

## 2025-01-18 DIAGNOSIS — I2489 Other forms of acute ischemic heart disease: Secondary | ICD-10-CM | POA: Diagnosis not present

## 2025-01-18 LAB — CBC WITH DIFFERENTIAL/PLATELET
Abs Immature Granulocytes: 0.26 10*3/uL — ABNORMAL HIGH (ref 0.00–0.07)
Basophils Absolute: 0.1 10*3/uL (ref 0.0–0.1)
Basophils Relative: 0 %
Eosinophils Absolute: 0.1 10*3/uL (ref 0.0–0.5)
Eosinophils Relative: 0 %
HCT: 38.5 % — ABNORMAL LOW (ref 39.0–52.0)
Hemoglobin: 13.7 g/dL (ref 13.0–17.0)
Immature Granulocytes: 1 %
Lymphocytes Relative: 7 %
Lymphs Abs: 2.2 10*3/uL (ref 0.7–4.0)
MCH: 33.3 pg (ref 26.0–34.0)
MCHC: 35.6 g/dL (ref 30.0–36.0)
MCV: 93.4 fL (ref 80.0–100.0)
Monocytes Absolute: 2.7 10*3/uL — ABNORMAL HIGH (ref 0.1–1.0)
Monocytes Relative: 8 %
Neutro Abs: 28.2 10*3/uL — ABNORMAL HIGH (ref 1.7–7.7)
Neutrophils Relative %: 84 %
Platelets: 395 10*3/uL (ref 150–400)
RBC: 4.12 MIL/uL — ABNORMAL LOW (ref 4.22–5.81)
RDW: 11 % — ABNORMAL LOW (ref 11.5–15.5)
Smear Review: NORMAL
WBC: 33.5 10*3/uL — ABNORMAL HIGH (ref 4.0–10.5)
nRBC: 0 % (ref 0.0–0.2)

## 2025-01-18 LAB — CBC
HCT: 38.2 % — ABNORMAL LOW (ref 39.0–52.0)
HCT: 37.3 % — ABNORMAL LOW (ref 39.0–52.0)
Hemoglobin: 13.4 g/dL (ref 13.0–17.0)
Hemoglobin: 13.6 g/dL (ref 13.0–17.0)
MCH: 33.2 pg (ref 26.0–34.0)
MCH: 33.4 pg (ref 26.0–34.0)
MCHC: 35.1 g/dL (ref 30.0–36.0)
MCHC: 36.5 g/dL — ABNORMAL HIGH (ref 30.0–36.0)
MCV: 94.6 fL (ref 80.0–100.0)
MCV: 91.6 fL (ref 80.0–100.0)
Platelets: 366 10*3/uL (ref 150–400)
Platelets: 395 10*3/uL (ref 150–400)
RBC: 4.04 MIL/uL — ABNORMAL LOW (ref 4.22–5.81)
RBC: 4.07 MIL/uL — ABNORMAL LOW (ref 4.22–5.81)
RDW: 11 % — ABNORMAL LOW (ref 11.5–15.5)
RDW: 11 % — ABNORMAL LOW (ref 11.5–15.5)
WBC: 33.2 10*3/uL — ABNORMAL HIGH (ref 4.0–10.5)
WBC: 33.1 10*3/uL — ABNORMAL HIGH (ref 4.0–10.5)
nRBC: 0 % (ref 0.0–0.2)
nRBC: 0 % (ref 0.0–0.2)

## 2025-01-18 LAB — ECHOCARDIOGRAM LIMITED
Area-P 1/2: 10.68 cm2
Calc EF: 55.8 %
Height: 72 in
Single Plane A2C EF: 53.9 %
Single Plane A4C EF: 55.2 %
Weight: 2879.98 [oz_av]

## 2025-01-18 LAB — TROPONIN T, HIGH SENSITIVITY
Troponin T High Sensitivity: 908 ng/L (ref 0–19)
Troponin T High Sensitivity: 949 ng/L (ref 0–19)

## 2025-01-18 LAB — BASIC METABOLIC PANEL WITH GFR
Anion gap: 15 (ref 5–15)
BUN: 20 mg/dL (ref 6–20)
CO2: 22 mmol/L (ref 22–32)
Calcium: 9.5 mg/dL (ref 8.9–10.3)
Chloride: 99 mmol/L (ref 98–111)
Creatinine, Ser: 0.95 mg/dL (ref 0.61–1.24)
GFR, Estimated: >60 mL/min
Glucose, Bld: 146 mg/dL — ABNORMAL HIGH (ref 70–99)
Potassium: 3.9 mmol/L (ref 3.5–5.1)
Sodium: 135 mmol/L (ref 135–145)

## 2025-01-18 LAB — PROCALCITONIN: Procalcitonin: 0.88 ng/mL

## 2025-01-18 LAB — PSA: Prostatic Specific Antigen: 17.1 ng/mL — ABNORMAL HIGH (ref 0.00–4.00)

## 2025-01-18 MED ORDER — GABAPENTIN 300 MG PO CAPS
300.0000 mg | ORAL_CAPSULE | Freq: Three times a day (TID) | ORAL | Status: DC
  Administered 2025-01-18 – 2025-01-25 (×21): 300 mg via ORAL
  Filled 2025-01-18 (×21): qty 1

## 2025-01-18 MED ORDER — AZITHROMYCIN 250 MG PO TABS
500.0000 mg | ORAL_TABLET | Freq: Every day | ORAL | Status: DC
  Administered 2025-01-18: 500 mg via ORAL
  Filled 2025-01-18: qty 2

## 2025-01-18 MED ORDER — METHOCARBAMOL 500 MG PO TABS
1000.0000 mg | ORAL_TABLET | Freq: Three times a day (TID) | ORAL | Status: DC
  Administered 2025-01-18 – 2025-01-22 (×14): 1000 mg via ORAL
  Filled 2025-01-18 (×14): qty 2

## 2025-01-18 MED ORDER — HYDROMORPHONE HCL 1 MG/ML IJ SOLN
0.5000 mg | INTRAMUSCULAR | Status: DC | PRN
  Filled 2025-01-18: qty 1

## 2025-01-18 MED ORDER — PANTOPRAZOLE SODIUM 40 MG PO TBEC
40.0000 mg | DELAYED_RELEASE_TABLET | Freq: Every day | ORAL | Status: DC
  Administered 2025-01-18 – 2025-01-25 (×8): 40 mg via ORAL
  Filled 2025-01-18 (×8): qty 1

## 2025-01-18 MED ORDER — LIDOCAINE 5 % EX PTCH
1.0000 | MEDICATED_PATCH | CUTANEOUS | Status: DC
  Administered 2025-01-18 – 2025-01-25 (×8): 1 via TRANSDERMAL
  Filled 2025-01-18 (×8): qty 1

## 2025-01-18 MED ORDER — METHOCARBAMOL 500 MG PO TABS
750.0000 mg | ORAL_TABLET | Freq: Three times a day (TID) | ORAL | Status: DC
  Administered 2025-01-18: 750 mg via ORAL
  Filled 2025-01-18: qty 2

## 2025-01-18 MED ORDER — MAGNESIUM CITRATE PO SOLN
1.0000 | Freq: Once | ORAL | Status: AC
  Administered 2025-01-19: 1 via ORAL
  Filled 2025-01-18: qty 296

## 2025-01-18 MED ORDER — PERFLUTREN LIPID MICROSPHERE
1.0000 mL | INTRAVENOUS | Status: AC | PRN
  Administered 2025-01-18: 2 mL via INTRAVENOUS

## 2025-01-18 MED ORDER — HYDROMORPHONE HCL 1 MG/ML IJ SOLN
1.0000 mg | INTRAMUSCULAR | Status: DC | PRN
  Administered 2025-01-18 – 2025-01-23 (×17): 1 mg via INTRAVENOUS
  Filled 2025-01-18 (×18): qty 1

## 2025-01-18 MED ORDER — HYDROMORPHONE HCL 1 MG/ML IJ SOLN
0.5000 mg | INTRAMUSCULAR | Status: DC | PRN
  Administered 2025-01-18 (×2): 0.5 mg via INTRAVENOUS
  Filled 2025-01-18 (×2): qty 1

## 2025-01-18 MED ORDER — ACETAMINOPHEN 500 MG PO TABS
1000.0000 mg | ORAL_TABLET | Freq: Four times a day (QID) | ORAL | Status: DC
  Administered 2025-01-18 – 2025-01-22 (×15): 1000 mg via ORAL
  Filled 2025-01-18 (×17): qty 2

## 2025-01-18 MED ORDER — SORBITOL 70 % SOLN
30.0000 mL | Freq: Once | Status: AC
  Administered 2025-01-18: 30 mL via ORAL
  Filled 2025-01-18: qty 30

## 2025-01-18 MED ORDER — CLOPIDOGREL BISULFATE 75 MG PO TABS
75.0000 mg | ORAL_TABLET | Freq: Every day | ORAL | Status: DC
  Administered 2025-01-19 – 2025-01-25 (×7): 75 mg via ORAL
  Filled 2025-01-18 (×7): qty 1

## 2025-01-18 MED ORDER — SODIUM CHLORIDE 0.9 % IV SOLN
1.0000 g | INTRAVENOUS | Status: DC
  Administered 2025-01-18: 1 g via INTRAVENOUS
  Filled 2025-01-18: qty 10

## 2025-01-18 MED ORDER — MELATONIN 3 MG PO TABS
3.0000 mg | ORAL_TABLET | Freq: Every day | ORAL | Status: DC
  Administered 2025-01-18 – 2025-01-24 (×8): 3 mg via ORAL
  Filled 2025-01-18 (×8): qty 1

## 2025-01-18 NOTE — Consult Note (Signed)
 Initial Consultation Note   Patient: Aaron [name] FMW:[id number] DOB: 12-08-1966 PCP: Patient, No Pcp Per DOA: 01/10/2025 DOS: the patient was seen and examined on 01/18/2025 Primary service: Wendel Lurena POUR, MD  Referring physician: Manuelita Rummer, PA Reason for consult: Intractable pain  Assessment/Plan: Assessment and Plan: 89M h/o CAD, HFmrEF, and HLD p/w STEMI s/p PCI to mid LAD (100% occlusion) via femoral approach on 01/10/2025 c/b bilateral PE and R femoral hematoma. Medicine consulted for multiple co-morbidities and pain control.  Chest pain Bilateral PE Possible RLL lung infarction on imaging Suspect new cp related to infarction, but can't exclude ongoing angina given presentation -Cards consulted; apprec eval/recs (agree with triple therapy for now w/ Eliquis , Plavix , and ASA; will need to monitor R groin hematoma carefully) -Tylenol  1g QID, robaxin  1g TID, gabapentin  300mg  TID (Aaron increase to 600mg  TID), and IV dilaudid  0.5-1mg  q3h prn (consider long acting opioids tomorrow pending pain control on IV)  -F/u troponin; if elevated will defer to Cards; agree with triple therapy for now  Leukocytosis CAP Elevated procalcitonin RLL opacity could be infarct given new PE, but elevated procalcitonin argues for active infection so will treat as CAP; low suspicion for malignancy, but will need to exclude given increased WBCs 12s>30s -IV CTX 1g daily to complete 5 day CAP course -PO azithromycin  500mg  daily to complete 3 day CAP course -Duonebs prn -Wean O2 as tolerated -Ambulatory pulse ox prior to d/c -F/u peripheral smear to exclude leukemia given WBC >30  R groin hematoma -Monitor closely given triple therapy w/ Eliquis , Plavix , and ASA   TRH will continue to follow the patient.  HPI: Aaron [name] is a 59 y.o. male with past medical history of CAD, HFmrEF, and HLD p/w STEMI s/p PCI to mid LAD (100% occlusion) via femoral approach on 01/10/2025 c/b bilateral PE and R  femoral hematoma. Medicine consulted for multiple co-morbidities and pain control.  The patient is a limited historian due to pain and difficulty breathing. He endorses ongoing cp, and notes that pain is worse with deep breathing. Current pain control with IV dilaudid  and fentanyl  is not adequate.   Review of Systems: As mentioned in the history of present illness. All other systems reviewed and are negative. Past Medical History:  Diagnosis Date   Colon polyps    benign per patient   Past Surgical History:  Procedure Laterality Date   COLONOSCOPY W/ BIOPSIES     CORONARY/GRAFT ACUTE MI REVASCULARIZATION N/A 01/10/2025   Procedure: Coronary/Graft Acute MI Revascularization;  Surgeon: Wendel Lurena POUR, MD;  Location: [facility];  Service: Cardiovascular;  Laterality: N/A;   LEFT HEART CATH AND CORONARY ANGIOGRAPHY N/A 01/10/2025   Procedure: LEFT HEART CATH AND CORONARY ANGIOGRAPHY;  Surgeon: Wendel Lurena POUR, MD;  Location: [facility]AS[facility] CV [facility];  Service: Cardiovascular;  Laterality: N/A;   LUMBAR SPINE SURGERY  2012   Social History:  reports that he has been smoking cigarettes. He has a 13 pack-year smoking history. He quit smokeless tobacco use about 27 years ago.  His smokeless tobacco use included chew. He reports current alcohol use. He reports that he does not use drugs.  Allergies[1]  Family History  Problem Relation Age of Onset   Heart disease Mother    Diabetes Father    Pancreatic cancer Father        patient thinks   Breast cancer Maternal Aunt    Colon cancer Neg Hx    Esophageal cancer Neg Hx  Prior to Admission medications  Medication Sig Start Date End Date Taking? Authorizing Provider  psyllium (REGULOID) 0.52 g capsule Take 0.52 g by mouth daily.   Yes [provider]  hydrocortisone  (ANUSOL -HC) 2.5 % rectal cream Place 1 Application rectally 2 (two) times daily. Patient not taking: Reported on 01/10/2025 09/28/24   Minnie Tinnie BRAVO, PA   lidocaine  (XYLOCAINE ) 2 % solution Use as directed 15 mLs in the mouth or throat as needed (rectal pain). Patient not taking: Reported on 01/10/2025 09/28/24   Minnie Tinnie BRAVO, PA    Physical Exam: Vitals:   01/18/25 0043 01/18/25 0408 01/18/25 0751 01/18/25 1217  BP: 100/67 (!) 138/107 (!) 136/109 (!) 120/91  Pulse: (!) 122 (!) 109 (!) 122 (!) 124  Resp: 20 20 17 16   Temp: 98.2 F (36.8 C) 98.3 F (36.8 C) 98 F (36.7 C) 98.5 F (36.9 C)  TempSrc: Oral Oral Oral Oral  SpO2: 97% 92% 94% 95%  Weight:      Height:       General: Alert, oriented x3, resting comfortably in no acute distress Respiratory: Diffuse rales (R>L); no wheezing Cardiovascular: tachycardic; no m/r/g   Data Reviewed:   Lab Results  Component Value Date   WBC 33.1 (H) 01/18/2025   HGB 13.6 01/18/2025   HCT 37.3 (L) 01/18/2025   MCV 91.6 01/18/2025   PLT 395 01/18/2025   Lab Results  Component Value Date   GLUCOSE 146 (H) 01/18/2025   CALCIUM  9.5 01/18/2025   NA 135 01/18/2025   K 3.9 01/18/2025   CO2 22 01/18/2025   CL 99 01/18/2025   BUN 20 01/18/2025   CREATININE 0.95 01/18/2025   Lab Results  Component Value Date   ALT 17 01/10/2025   AST 22 01/10/2025   ALKPHOS 78 01/10/2025   BILITOT 0.3 01/10/2025   Lab Results  Component Value Date   INR 1.1 01/10/2025   Radiology: CT Angio Chest Pulmonary Embolism (PE) W or WO Contrast Result Date: 01/17/2025 CLINICAL DATA:  Concern for pulmonary embolism. Retroperitoneal bleed. EXAM: CT ANGIOGRAPHY CHEST CT ABDOMEN AND PELVIS WITH CONTRAST TECHNIQUE: Multidetector CT imaging of the chest was performed using the standard protocol during bolus administration of intravenous contrast. Multiplanar CT image reconstructions and MIPs were obtained to evaluate the vascular anatomy. Multidetector CT imaging of the abdomen and pelvis was performed using the standard protocol during bolus administration of intravenous contrast. RADIATION DOSE REDUCTION: This  exam was performed according to the departmental dose-optimization program which includes automated exposure control, adjustment of the mA and/or kV according to patient size and/or use of iterative reconstruction technique. CONTRAST:  75mL OMNIPAQUE  IOHEXOL  350 MG/ML SOLN COMPARISON:  CT abdomen pelvis dated 01/11/2025. FINDINGS: CTA CHEST FINDINGS Cardiovascular: There is no cardiomegaly. Trace pericardial effusion. The thoracic aorta is unremarkable. The origins of the great vessels of the aortic arch are patent. Bilateral pulmonary artery emboli involving the lobar branches of the lower lobes bilaterally extending into the segmental branches. Additional smaller pulmonary artery emboli involving this segmental branches of the upper lobes. No CT evidence of right heart strain. Mediastinum/Nodes: No hilar or mediastinal adenopathy. The esophagus is grossly unremarkable. No mediastinal fluid collection. Lungs/Pleura: Bilateral lower lobe patchy consolidative changes Aaron represent atelectasis or pneumonia versus areas of developing infarct. Trace bilateral pleural effusions noted. No pneumothorax. The central airways are patent. Musculoskeletal: No acute osseous pathology. Review of the MIP images confirms the above findings. CT ABDOMEN and PELVIS FINDINGS No intra-abdominal free air or  free fluid. Hepatobiliary: Small liver cysts. No biliary dilatation. The gallbladder is unremarkable. Pancreas: Unremarkable. No pancreatic ductal dilatation or surrounding inflammatory changes. Spleen: Normal in size without focal abnormality. Adrenals/Urinary Tract: A subcentimeter indeterminate right adrenal nodule. There is no hydronephrosis on either side. There is symmetric enhancement and excretion of contrast by both kidneys. The visualized ureters and urinary bladder appear unremarkable. Stomach/Bowel: Sigmoid diverticulosis. There is moderate stool throughout the colon. There is no bowel obstruction or active inflammation.  The appendix is normal. Vascular/Lymphatic: Mild aortoiliac atherosclerotic disease. The IVC is unremarkable. No portal venous gas. There is no adenopathy. Reproductive: The prostate and seminal vesicles are grossly unremarkable. Diffuse scrotal wall swelling and edema. Other: Right inguinal canal hematoma similar or slightly decreased since the prior CT. No retroperitoneal bleed. Musculoskeletal: No acute osseous pathology. Review of the MIP images confirms the above findings. IMPRESSION: 1. Bilateral pulmonary artery emboli. No CT evidence of right heart strain. 2. Bilateral lower lobe patchy consolidative changes Aaron represent atelectasis or pneumonia versus areas of developing infarct. 3. Right inguinal canal hematoma similar or slightly decreased since the prior CT. No retroperitoneal bleed. 4. Sigmoid diverticulosis. No bowel obstruction. Normal appendix. 5.  Aortic Atherosclerosis (ICD10-I70.0). These results will be called to the ordering clinician or representative by the Radiologist Assistant, and communication documented in the PACS or [employer]. Electronically Signed   By: Vanetta Chou M.D.   On: 01/17/2025 16:52   CT ABDOMEN PELVIS W CONTRAST Result Date: 01/17/2025 CLINICAL DATA:  Concern for pulmonary embolism. Retroperitoneal bleed. EXAM: CT ANGIOGRAPHY CHEST CT ABDOMEN AND PELVIS WITH CONTRAST TECHNIQUE: Multidetector CT imaging of the chest was performed using the standard protocol during bolus administration of intravenous contrast. Multiplanar CT image reconstructions and MIPs were obtained to evaluate the vascular anatomy. Multidetector CT imaging of the abdomen and pelvis was performed using the standard protocol during bolus administration of intravenous contrast. RADIATION DOSE REDUCTION: This exam was performed according to the departmental dose-optimization program which includes automated exposure control, adjustment of the mA and/or kV according to patient size and/or use of  iterative reconstruction technique. CONTRAST:  75mL OMNIPAQUE  IOHEXOL  350 MG/ML SOLN COMPARISON:  CT abdomen pelvis dated 01/11/2025. FINDINGS: CTA CHEST FINDINGS Cardiovascular: There is no cardiomegaly. Trace pericardial effusion. The thoracic aorta is unremarkable. The origins of the great vessels of the aortic arch are patent. Bilateral pulmonary artery emboli involving the lobar branches of the lower lobes bilaterally extending into the segmental branches. Additional smaller pulmonary artery emboli involving this segmental branches of the upper lobes. No CT evidence of right heart strain. Mediastinum/Nodes: No hilar or mediastinal adenopathy. The esophagus is grossly unremarkable. No mediastinal fluid collection. Lungs/Pleura: Bilateral lower lobe patchy consolidative changes Aaron represent atelectasis or pneumonia versus areas of developing infarct. Trace bilateral pleural effusions noted. No pneumothorax. The central airways are patent. Musculoskeletal: No acute osseous pathology. Review of the MIP images confirms the above findings. CT ABDOMEN and PELVIS FINDINGS No intra-abdominal free air or free fluid. Hepatobiliary: Small liver cysts. No biliary dilatation. The gallbladder is unremarkable. Pancreas: Unremarkable. No pancreatic ductal dilatation or surrounding inflammatory changes. Spleen: Normal in size without focal abnormality. Adrenals/Urinary Tract: A subcentimeter indeterminate right adrenal nodule. There is no hydronephrosis on either side. There is symmetric enhancement and excretion of contrast by both kidneys. The visualized ureters and urinary bladder appear unremarkable. Stomach/Bowel: Sigmoid diverticulosis. There is moderate stool throughout the colon. There is no bowel obstruction or active inflammation. The appendix is normal. Vascular/Lymphatic: Mild  aortoiliac atherosclerotic disease. The IVC is unremarkable. No portal venous gas. There is no adenopathy. Reproductive: The prostate and  seminal vesicles are grossly unremarkable. Diffuse scrotal wall swelling and edema. Other: Right inguinal canal hematoma similar or slightly decreased since the prior CT. No retroperitoneal bleed. Musculoskeletal: No acute osseous pathology. Review of the MIP images confirms the above findings. IMPRESSION: 1. Bilateral pulmonary artery emboli. No CT evidence of right heart strain. 2. Bilateral lower lobe patchy consolidative changes Aaron represent atelectasis or pneumonia versus areas of developing infarct. 3. Right inguinal canal hematoma similar or slightly decreased since the prior CT. No retroperitoneal bleed. 4. Sigmoid diverticulosis. No bowel obstruction. Normal appendix. 5.  Aortic Atherosclerosis (ICD10-I70.0). These results will be called to the ordering clinician or representative by the Radiologist Assistant, and communication documented in the PACS or [employer]. Electronically Signed   By: Vanetta Chou M.D.   On: 01/17/2025 16:52     Family Communication: N/A Primary team communication: N/A  Thank you very much for involving us  in the care of your patient.   ------- I spent 45 minutes reviewing previous notes, at the bedside counseling/discussing the treatment plan, and performing clinical documentation.  Author: Marsha Ada, MD 01/18/2025 1:16 PM  For on call review [email or web address].     [1] No Known Allergies

## 2025-01-18 NOTE — Progress Notes (Signed)
 Pt called and said that he is in excruciating pain. Pt could been seen grabbing the bed side rails and groaning in pain. Pt HR went up to 150s. Pt stated that the pain radiates to different parts of his  body. Damarcus Ingarm, MD was notified. Pt pain medication was modified and a one time dose of oxycodone  was added. Oxycodone  was administered to pt for 10/10 pain.

## 2025-01-18 NOTE — Progress Notes (Signed)
 "  Progress Note  Patient Name: Aaron [name] Date of Encounter: 01/18/2025 [facility] Cardiologist: Arun K Thukkani, MD   Interval Summary    Continues to have significant right sided groin and flank pain. No chest pain.   Vital Signs Vitals:   01/17/25 2125 01/17/25 2328 01/18/25 0043 01/18/25 0408  BP: 127/79 127/79 100/67 (!) 138/107  Pulse: (!) 142 (!) 152 (!) 122 (!) 109  Resp: 20  20 20   Temp: 98 F (36.7 C)  98.2 F (36.8 C) 98.3 F (36.8 C)  TempSrc: Oral  Oral Oral  SpO2: 96% 94% 97% 92%  Weight:      Height:        Intake/Output Summary (Last 24 hours) at 01/18/2025 0706 Last data filed at 01/17/2025 1856 Gross per 24 hour  Intake 240 ml  Output 425 ml  Net -185 ml      01/11/2025    8:35 PM 01/10/2025    2:00 PM 05/05/2021   10:31 AM  Last 3 Weights  Weight (lbs) 180 lb 180 lb 205 lb  Weight (kg) 81.647 kg 81.647 kg 92.987 kg      Telemetry/ECG   Sinus tachycardia, rates 120s - Personally Reviewed  Physical Exam  GEN: No acute distress.   Neck: No JVD Cardiac: tachycardic, no murmurs, rubs, or gallops.  Respiratory: Diminished in bases  GI: Soft, nontender, non-distended  MS: No edema  Assessment & Plan   59 y.o. male with PMH of hemorrhoid and recent tooth infection who was seen 01/10/2025 for the evaluation of STEMI.    Anterior STEMI -- Underwent cardiac catheterization 1/21 with 100% thrombotic occluded mid LAD treated with PCI/DES x 1.  Recommendations for DAPT with aspirin /Brilinta  for at least 1 year.  No recurrent chest pain. With the need for OAC, transition to Plavix  with 300 mg load x 1 today.  Then 75 mg daily thereafter -- Continue aspirin , atorvastatin  80 mg daily.  May need to consider dropping aspirin  given the need for oral anticoagulant in the setting of PEs.  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  -- Repeat CT 1/28 with right inguinal canal hematoma similar slightly decreased since prior.  No retroperitoneal bleed -- Site remains stable though continues to have significant pain at the site and difficulty with ambulation  -- question if this could be MSK related/spasms?  Will increase tylenol  to 1000mg  QID, add robaxin  and lidoderm  patch   Leukocytosis -- WBC elevated at 33 this morning, no fevers overnight -- CT chest with bilateral lower lobe patchy consolidative changes with possible atelectasis versus pneumonia -- consult IM   Right renal nodule -- noted on CT, likely benign. Follow up with PCP    Has been working with PT but minimal  improvement, discussed possible placement yesterday but he declines SNF at this time    For questions or updates, please contact [facility] Please consult [email or web address] for contact info under         Signed, Manuelita Rummer, NP   "

## 2025-01-18 NOTE — Plan of Care (Signed)
" °  Problem: Education: Goal: Knowledge of General Education information will improve Description: Including pain rating scale, medication(s)/side effects and non-pharmacologic comfort measures Outcome: Progressing   Problem: Health Behavior/Discharge Planning: Goal: Ability to manage health-related needs will improve Outcome: Progressing   Problem: Clinical Measurements: Goal: Ability to maintain clinical measurements within normal limits will improve Outcome: Progressing   Problem: Clinical Measurements: Goal: Will remain free from infection Outcome: Progressing   Problem: Clinical Measurements: Goal: Diagnostic test results will improve Outcome: Progressing   Problem: Clinical Measurements: Goal: Cardiovascular complication will be avoided Outcome: Progressing   Problem: Activity: Goal: Ability to return to baseline activity level will improve Outcome: Progressing   Problem: Skin Integrity: Goal: Risk for impaired skin integrity will decrease Outcome: Progressing   Problem: Health Behavior/Discharge Planning: Goal: Ability to safely manage health-related needs after discharge will improve Outcome: Progressing   "

## 2025-01-18 NOTE — Progress Notes (Signed)
 Bilateral lower extremity venous duplex has been completed.  Results can be found in chart review under CV Proc.  01/18/2025 2:49 PM  Jame Seelig Elden Appl, RVT.

## 2025-01-18 NOTE — Telephone Encounter (Signed)
 Patient Product/process development scientist completed.    The patient is insured through [facility] MEDICAID.     Ran test claim for Eliquis  5 mg and the current 30 day co-pay is $4.00.   This test claim was processed through [facility]- copay amounts may vary at other pharmacies due to pharmacy/plan contracts, or as the patient moves through the different stages of their insurance plan.     Reyes Sharps, CPHT Pharmacy Technician Patient Advocate Specialist Lead [facility] Patient Advocate Team Direct Number: [phone]  Fax: [phone]

## 2025-01-18 NOTE — Progress Notes (Signed)
 Pt called that he is in excruciating pain. Pt could been seen grabbing the bed side rails and groaning in pain pt HR goes up to 150s and 160s. Pt stated that the pain radiates to different parts of his  body. Pt HR drops to 120s and below whenever pt is not grimacing in pain. MD was notified. Pain medications was adjusted and new order placed.

## 2025-01-18 NOTE — TOC Progression Note (Addendum)
 Transition of Care [facility]) - Progression Note    Patient Details  Name: Aaron [name] MRN: [id number] Date of Birth: Jul 16, 1966  Transition of Care [facility] CM/SW Contact  Isaiah Public, LCSWA Phone Number: 01/18/2025, 12:41 PM  Clinical Narrative:     No current SNF bed offers. CSW spoke with  [facility]ard with [facility] who was considering on making SNF bed offer for patient. Burnard in admissions informed CSW that patients insurance coverage is showing that it ends on 1/31. Burnard informed CSW that facility unable to make SNF bed offer.CSW informed patients Aaron [name]. [name] requested for CSW to meet with her and patient in the room to discuss with patient information received. CSW plans to meet with Aaron and patient this afternoon. All questions answered. No further questions reported at this time.  Update- CSW updated patient and with patients permission patients Aaron and family at bedside on status of SNF placement and patients insurance. Patient expressed understanding. Patient gave CSW permission to reach out to financial counseling to request to screen him for Medicaid.CSW informed [place] with financial counseling. CSW requested for patient to be screened for Medicaid. Patient plans to work more with PT to get strong enough to return home when medically ready. CSW will continue to follow.  Expected Discharge Plan: OP Rehab Barriers to Discharge: No Barriers Identified               Expected Discharge Plan and Services In-house Referral: Clinical Social Work Discharge Planning Services: CM Consult Post Acute Care Choice: NA Living arrangements for the past 2 months: Single Family Home                   DME Agency: NA                   Social Drivers of Health (SDOH) Interventions SDOH Screenings   Food Insecurity: No Food Insecurity (01/11/2025)  Housing: Low Risk (01/11/2025)  Transportation Needs: No Transportation Needs (01/11/2025)  Utilities: Not  At Risk (01/11/2025)  Tobacco Use: High Risk (01/11/2025)    Readmission Risk Interventions     No data to display

## 2025-01-19 DIAGNOSIS — S3012XA Contusion of groin, initial encounter: Secondary | ICD-10-CM | POA: Insufficient documentation

## 2025-01-19 DIAGNOSIS — I2102 ST elevation (STEMI) myocardial infarction involving left anterior descending coronary artery: Secondary | ICD-10-CM

## 2025-01-19 DIAGNOSIS — I2699 Other pulmonary embolism without acute cor pulmonale: Secondary | ICD-10-CM | POA: Diagnosis not present

## 2025-01-19 DIAGNOSIS — D72829 Elevated white blood cell count, unspecified: Secondary | ICD-10-CM | POA: Insufficient documentation

## 2025-01-19 LAB — BASIC METABOLIC PANEL WITH GFR
Anion gap: 13 (ref 5–15)
BUN: 40 mg/dL — ABNORMAL HIGH (ref 6–20)
CO2: 22 mmol/L (ref 22–32)
Calcium: 9.3 mg/dL (ref 8.9–10.3)
Chloride: 98 mmol/L (ref 98–111)
Creatinine, Ser: 1.06 mg/dL (ref 0.61–1.24)
GFR, Estimated: >60 mL/min
Glucose, Bld: 140 mg/dL — ABNORMAL HIGH (ref 70–99)
Potassium: 3.7 mmol/L (ref 3.5–5.1)
Sodium: 133 mmol/L — ABNORMAL LOW (ref 135–145)

## 2025-01-19 LAB — CBC
HCT: 33.5 % — ABNORMAL LOW (ref 39.0–52.0)
Hemoglobin: 12 g/dL — ABNORMAL LOW (ref 13.0–17.0)
MCH: 33.6 pg (ref 26.0–34.0)
MCHC: 35.8 g/dL (ref 30.0–36.0)
MCV: 93.8 fL (ref 80.0–100.0)
Platelets: 379 10*3/uL (ref 150–400)
RBC: 3.57 MIL/uL — ABNORMAL LOW (ref 4.22–5.81)
RDW: 11.2 % — ABNORMAL LOW (ref 11.5–15.5)
WBC: 33 10*3/uL — ABNORMAL HIGH (ref 4.0–10.5)
nRBC: 0 % (ref 0.0–0.2)

## 2025-01-19 LAB — HEPATIC FUNCTION PANEL
ALT: 16 U/L (ref 0–44)
AST: 26 U/L (ref 15–41)
Albumin: 3.5 g/dL (ref 3.5–5.0)
Alkaline Phosphatase: 108 U/L (ref 38–126)
Bilirubin, Direct: 0.4 mg/dL — ABNORMAL HIGH (ref 0.0–0.2)
Indirect Bilirubin: 0.5 mg/dL (ref 0.3–0.9)
Total Bilirubin: 0.9 mg/dL (ref 0.0–1.2)
Total Protein: 7.1 g/dL (ref 6.5–8.1)

## 2025-01-19 MED ORDER — AMOXICILLIN-POT CLAVULANATE 875-125 MG PO TABS
1.0000 | ORAL_TABLET | Freq: Two times a day (BID) | ORAL | Status: AC
  Administered 2025-01-19 – 2025-01-20 (×4): 1 via ORAL
  Filled 2025-01-19 (×4): qty 1

## 2025-01-19 MED ORDER — AZITHROMYCIN 250 MG PO TABS
500.0000 mg | ORAL_TABLET | Freq: Every day | ORAL | Status: AC
  Administered 2025-01-19 – 2025-01-21 (×3): 500 mg via ORAL
  Filled 2025-01-19 (×3): qty 2

## 2025-01-19 NOTE — Progress Notes (Signed)
 [facility] CONSULT PROGRESS NOTE    Progress Note  Aaron [name]  FMW:[id number] DOB: 1966/04/26 DOA: 01/10/2025 PCP: Patient, No Pcp Per     Brief Narrative:   Aaron [name] is an 59 y.o. male past medical history of HFrEF, admitted by cardiology for NSTEMI on 01/10/2025 status post PCI we are consulted for pulmonary infarction on imaging secondary to bilateral PE   Assessment/Plan:   STEMI involving left anterior descending coronary artery [facility]) Admitted and stented by cardiology on 01/10/2025. Currently on DAPT therapy aspirin  and Plavix . Also on statins. Further management per cardiology.  Acute bilateral PE: Appreciated on CT angio of the chest on 01/17/2025, no right heart strain.  With possible developing infarct. Agree with Eliquis . Now on triple therapy aspirin , Plavix  and Eliquis  Currently on Tylenol  Robaxin  gabapentin  and IV Dilaudid . Pain is improved denies any shortness of breath. Will continue to follow the distance.  Leukocytosis: Procalcitonin is low yield, is probably acting as an acute phase reactant. Leukocytosis is probably due to pulmonary infarction, as he has remained afebrile. He denies any cough.  Started empirically on IV Rocephin  and azithromycin . Will de-escalate to oral Augmentin  and azithromycin  orally . Unlikely an infectious etiology but will finish a 5-day course of antibiotics. He is satting greater 96% on room air. Out of bed to chair continue sensi spirometry.  Hyperlipidemia:  continue statins.  Acute HFrEF: 2D echo showed an EF of 45% GDMT on Toprol  and losartan . Further management per cardiology.  Right groin hematoma: Hemoglobin stable around 12-13, continue to monitor closely. He is on triple therapy.  Right renal nodule: Noted on CT will need to follow-up with CT with a follow-up imaging.  Disposition: Patient ambulation was limited with PT and has had some very minimal improvement. Patient agreed with me this  morning to go to skilled nursing facility.  Right groin hematoma: Currently on aspirin  Plavix  and Eliquis . Further management per cardiology.   DVT prophylaxis: Eliquis  Family Communication:wife Status is: Inpatient Remains inpatient appropriate because: Acute STEMI and bilateral PEs    Code Status:     Code Status Orders  (From admission, onward)           Start     Ordered   01/10/25 1712  Full code  Continuous       Question:  By:  Answer:  Consent: discussion documented in EHR   01/10/25 1711           Code Status History     This patient has a current code status but no historical code status.         IV Access:   Peripheral IV   Procedures and diagnostic studies:   ECHOCARDIOGRAM LIMITED Result Date: 01/18/2025    ECHOCARDIOGRAM LIMITED REPORT   Patient Name:   Aaron [name] Date of Exam: 01/18/2025 Medical Rec #:  [id number]       Height:       72.0 in Accession #:    [id number]      Weight:       180.0 lb Date of Birth:  1966-04-07        BSA:          2.037 m Patient Age:    59 years        BP:           136/109 mmHg Patient Gender: M               HR:  121 bpm. Exam Location:  Inpatient Procedure: Limited Echo, Limited Color Doppler and Intracardiac Opacification            Agent (Both Spectral and Color Flow Doppler were utilized during            procedure). Indications:    Acute ischemic heart disease, unspecified I24.9  History:        Patient has prior history of Echocardiogram examinations, most                 recent 01/11/2025.  Sonographer:    Sydnee Wilson RDCS Referring Phys: MANUELITA KATHEE RUMMER IMPRESSIONS  1. Left ventricular ejection fraction, by estimation, is 45 to 50%. The left ventricle has mildly decreased function. The left ventricle demonstrates regional wall motion abnormalities (see scoring diagram/findings for description). Left ventricular diastolic function could not be evaluated. There is akinesis of the left ventricular,  apical septal wall, anterior wall and anterolateral wall. There is akinesis of the left ventricular, entire apical segment. There is akinesis of the left ventricular, mid anteroseptal wall.  2. Right ventricular systolic function is normal. The right ventricular size is normal.  3. The mitral valve is degenerative. Trivial mitral valve regurgitation. No evidence of mitral stenosis.  4. The aortic valve is tricuspid. Aortic valve regurgitation is not visualized. Aortic valve sclerosis is present, with no evidence of aortic valve stenosis.  5. The inferior vena cava is normal in size with greater than 50% respiratory variability, suggesting right atrial pressure of 3 mmHg.  6. A small pericardial effusion is present. The pericardial effusion is anterior to the right ventricle. FINDINGS  Left Ventricle: Left ventricular ejection fraction, by estimation, is 45 to 50%. The left ventricle has mildly decreased function. The left ventricle demonstrates regional wall motion abnormalities. Definity  contrast agent was given IV to delineate the left ventricular endocardial borders. The left ventricular internal cavity size was normal in size. There is no left ventricular hypertrophy. Left ventricular diastolic function could not be evaluated. Right Ventricle: The right ventricular size is normal. No increase in right ventricular wall thickness. Right ventricular systolic function is normal. Left Atrium: Left atrial size was normal in size. Right Atrium: Right atrial size was normal in size. Pericardium: A small pericardial effusion is present. The pericardial effusion is anterior to the right ventricle. Mitral Valve: The mitral valve is degenerative in appearance. There is mild calcification of the mitral valve leaflet(s). Trivial mitral valve regurgitation. No evidence of mitral valve stenosis. Tricuspid Valve: The tricuspid valve is normal in structure. Tricuspid valve regurgitation is not demonstrated. No evidence of  tricuspid stenosis. Aortic Valve: The aortic valve is tricuspid. Aortic valve regurgitation is not visualized. Aortic valve sclerosis is present, with no evidence of aortic valve stenosis. Pulmonic Valve: The pulmonic valve was normal in structure. Pulmonic valve regurgitation is not visualized. No evidence of pulmonic stenosis. Aorta: The aortic root is normal in size and structure. Venous: The inferior vena cava is normal in size with greater than 50% respiratory variability, suggesting right atrial pressure of 3 mmHg. IAS/Shunts: No atrial level shunt detected by color flow Doppler.  LV Volumes (MOD) LV vol d, MOD A2C: 78.8 ml  Diastology LV vol d, MOD A4C: 106.0 ml LV e' medial:    5.44 cm/s LV vol s, MOD A2C: 36.3 ml  LV E/e' medial:  14.8 LV vol s, MOD A4C: 47.5 ml  LV e' lateral:   12.70 cm/s LV SV MOD A2C:     42.5  ml  LV E/e' lateral: 6.4 LV SV MOD A4C:     106.0 ml LV SV MOD BP:      52.3 ml RIGHT VENTRICLE RV S prime:     17.30 cm/s TAPSE (M-mode): 1.8 cm MITRAL VALVE MV Area (PHT): 10.68 cm MV Decel Time: 71 msec MV E velocity: 80.70 cm/s MV A velocity: 76.00 cm/s MV E/A ratio:  1.06 Wilbert Bihari MD Electronically signed by Wilbert Bihari MD Signature Date/Time: 01/18/2025/4:33:50 PM    Final    VAS US  LOWER EXTREMITY VENOUS (DVT) Result Date: 01/18/2025  Lower Venous DVT Study Patient Name:  CHASEN MENDELL  Date of Exam:   01/18/2025 Medical Rec #: [id number]        Accession #:    [id number] Date of Birth: 1966/09/27         Patient Gender: M Patient Age:   48 years Exam Location:  [facility] Procedure:      VAS US  LOWER EXTREMITY VENOUS (DVT) Referring Phys: MANUELITA ROBERTS --------------------------------------------------------------------------------  Indications: Pulmonary embolism.  Comparison Study: No prior exam. Performing Technologist: Edilia Elden Appl  Examination Guidelines: A complete evaluation includes B-mode imaging, spectral Doppler, color Doppler, and power Doppler as needed  of all accessible portions of each vessel. Bilateral testing is considered an integral part of a complete examination. Limited examinations for reoccurring indications may be performed as noted. The reflux portion of the exam is performed with the patient in reverse Trendelenburg.  +---------+---------------+---------+-----------+----------+--------------+ RIGHT    CompressibilityPhasicitySpontaneityPropertiesThrombus Aging +---------+---------------+---------+-----------+----------+--------------+ CFV      Full           Yes      Yes                                 +---------+---------------+---------+-----------+----------+--------------+ SFJ      Full           Yes      Yes                                 +---------+---------------+---------+-----------+----------+--------------+ FV Prox  Full                                                        +---------+---------------+---------+-----------+----------+--------------+ FV Mid   Full                                                        +---------+---------------+---------+-----------+----------+--------------+ FV DistalFull                                                        +---------+---------------+---------+-----------+----------+--------------+ PFV      Full           Yes      Yes                                 +---------+---------------+---------+-----------+----------+--------------+  POP      Full           Yes      Yes                                 +---------+---------------+---------+-----------+----------+--------------+ PTV      Full                                                        +---------+---------------+---------+-----------+----------+--------------+ PERO     Full                                                        +---------+---------------+---------+-----------+----------+--------------+    +---------+---------------+---------+-----------+----------+--------------+ LEFT     CompressibilityPhasicitySpontaneityPropertiesThrombus Aging +---------+---------------+---------+-----------+----------+--------------+ CFV      Full           Yes      Yes                                 +---------+---------------+---------+-----------+----------+--------------+ SFJ      Full           Yes      Yes                                 +---------+---------------+---------+-----------+----------+--------------+ FV Prox  Full                                                        +---------+---------------+---------+-----------+----------+--------------+ FV Mid   Full                                                        +---------+---------------+---------+-----------+----------+--------------+ FV DistalFull                                                        +---------+---------------+---------+-----------+----------+--------------+ PFV      Full                                                        +---------+---------------+---------+-----------+----------+--------------+ POP      Full           Yes      Yes                  Rouleaux flow  +---------+---------------+---------+-----------+----------+--------------+ PTV  Full                                                        +---------+---------------+---------+-----------+----------+--------------+ PERO     None           No       No                                  +---------+---------------+---------+-----------+----------+--------------+ Deep vein thrombosis noted in one of the paired peroneal veins.    Summary: RIGHT: - There is no evidence of deep vein thrombosis in the lower extremity.  - No cystic structure found in the popliteal fossa.  LEFT: - Findings consistent with acute deep vein thrombosis involving the left popliteal vein.  - No cystic structure found in the popliteal  fossa.  *See table(s) above for measurements and observations. Electronically signed by Norman Serve on 01/18/2025 at 4:03:53 PM.    Final    CT Angio Chest Pulmonary Embolism (PE) W or WO Contrast Result Date: 01/17/2025 CLINICAL DATA:  Concern for pulmonary embolism. Retroperitoneal bleed. EXAM: CT ANGIOGRAPHY CHEST CT ABDOMEN AND PELVIS WITH CONTRAST TECHNIQUE: Multidetector CT imaging of the chest was performed using the standard protocol during bolus administration of intravenous contrast. Multiplanar CT image reconstructions and MIPs were obtained to evaluate the vascular anatomy. Multidetector CT imaging of the abdomen and pelvis was performed using the standard protocol during bolus administration of intravenous contrast. RADIATION DOSE REDUCTION: This exam was performed according to the departmental dose-optimization program which includes automated exposure control, adjustment of the mA and/or kV according to patient size and/or use of iterative reconstruction technique. CONTRAST:  75mL OMNIPAQUE  IOHEXOL  350 MG/ML SOLN COMPARISON:  CT abdomen pelvis dated 01/11/2025. FINDINGS: CTA CHEST FINDINGS Cardiovascular: There is no cardiomegaly. Trace pericardial effusion. The thoracic aorta is unremarkable. The origins of the great vessels of the aortic arch are patent. Bilateral pulmonary artery emboli involving the lobar branches of the lower lobes bilaterally extending into the segmental branches. Additional smaller pulmonary artery emboli involving this segmental branches of the upper lobes. No CT evidence of right heart strain. Mediastinum/Nodes: No hilar or mediastinal adenopathy. The esophagus is grossly unremarkable. No mediastinal fluid collection. Lungs/Pleura: Bilateral lower lobe patchy consolidative changes may represent atelectasis or pneumonia versus areas of developing infarct. Trace bilateral pleural effusions noted. No pneumothorax. The central airways are patent. Musculoskeletal: No acute  osseous pathology. Review of the MIP images confirms the above findings. CT ABDOMEN and PELVIS FINDINGS No intra-abdominal free air or free fluid. Hepatobiliary: Small liver cysts. No biliary dilatation. The gallbladder is unremarkable. Pancreas: Unremarkable. No pancreatic ductal dilatation or surrounding inflammatory changes. Spleen: Normal in size without focal abnormality. Adrenals/Urinary Tract: A subcentimeter indeterminate right adrenal nodule. There is no hydronephrosis on either side. There is symmetric enhancement and excretion of contrast by both kidneys. The visualized ureters and urinary bladder appear unremarkable. Stomach/Bowel: Sigmoid diverticulosis. There is moderate stool throughout the colon. There is no bowel obstruction or active inflammation. The appendix is normal. Vascular/Lymphatic: Mild aortoiliac atherosclerotic disease. The IVC is unremarkable. No portal venous gas. There is no adenopathy. Reproductive: The prostate and seminal vesicles are grossly unremarkable. Diffuse scrotal wall swelling and edema. Other: Right inguinal canal hematoma similar or slightly  decreased since the prior CT. No retroperitoneal bleed. Musculoskeletal: No acute osseous pathology. Review of the MIP images confirms the above findings. IMPRESSION: 1. Bilateral pulmonary artery emboli. No CT evidence of right heart strain. 2. Bilateral lower lobe patchy consolidative changes may represent atelectasis or pneumonia versus areas of developing infarct. 3. Right inguinal canal hematoma similar or slightly decreased since the prior CT. No retroperitoneal bleed. 4. Sigmoid diverticulosis. No bowel obstruction. Normal appendix. 5.  Aortic Atherosclerosis (ICD10-I70.0). These results will be called to the ordering clinician or representative by the Radiologist Assistant, and communication documented in the PACS or [employer]. Electronically Signed   By: Vanetta Chou M.D.   On: 01/17/2025 16:52   CT ABDOMEN  PELVIS W CONTRAST Result Date: 01/17/2025 CLINICAL DATA:  Concern for pulmonary embolism. Retroperitoneal bleed. EXAM: CT ANGIOGRAPHY CHEST CT ABDOMEN AND PELVIS WITH CONTRAST TECHNIQUE: Multidetector CT imaging of the chest was performed using the standard protocol during bolus administration of intravenous contrast. Multiplanar CT image reconstructions and MIPs were obtained to evaluate the vascular anatomy. Multidetector CT imaging of the abdomen and pelvis was performed using the standard protocol during bolus administration of intravenous contrast. RADIATION DOSE REDUCTION: This exam was performed according to the departmental dose-optimization program which includes automated exposure control, adjustment of the mA and/or kV according to patient size and/or use of iterative reconstruction technique. CONTRAST:  75mL OMNIPAQUE  IOHEXOL  350 MG/ML SOLN COMPARISON:  CT abdomen pelvis dated 01/11/2025. FINDINGS: CTA CHEST FINDINGS Cardiovascular: There is no cardiomegaly. Trace pericardial effusion. The thoracic aorta is unremarkable. The origins of the great vessels of the aortic arch are patent. Bilateral pulmonary artery emboli involving the lobar branches of the lower lobes bilaterally extending into the segmental branches. Additional smaller pulmonary artery emboli involving this segmental branches of the upper lobes. No CT evidence of right heart strain. Mediastinum/Nodes: No hilar or mediastinal adenopathy. The esophagus is grossly unremarkable. No mediastinal fluid collection. Lungs/Pleura: Bilateral lower lobe patchy consolidative changes may represent atelectasis or pneumonia versus areas of developing infarct. Trace bilateral pleural effusions noted. No pneumothorax. The central airways are patent. Musculoskeletal: No acute osseous pathology. Review of the MIP images confirms the above findings. CT ABDOMEN and PELVIS FINDINGS No intra-abdominal free air or free fluid. Hepatobiliary: Small liver cysts. No  biliary dilatation. The gallbladder is unremarkable. Pancreas: Unremarkable. No pancreatic ductal dilatation or surrounding inflammatory changes. Spleen: Normal in size without focal abnormality. Adrenals/Urinary Tract: A subcentimeter indeterminate right adrenal nodule. There is no hydronephrosis on either side. There is symmetric enhancement and excretion of contrast by both kidneys. The visualized ureters and urinary bladder appear unremarkable. Stomach/Bowel: Sigmoid diverticulosis. There is moderate stool throughout the colon. There is no bowel obstruction or active inflammation. The appendix is normal. Vascular/Lymphatic: Mild aortoiliac atherosclerotic disease. The IVC is unremarkable. No portal venous gas. There is no adenopathy. Reproductive: The prostate and seminal vesicles are grossly unremarkable. Diffuse scrotal wall swelling and edema. Other: Right inguinal canal hematoma similar or slightly decreased since the prior CT. No retroperitoneal bleed. Musculoskeletal: No acute osseous pathology. Review of the MIP images confirms the above findings. IMPRESSION: 1. Bilateral pulmonary artery emboli. No CT evidence of right heart strain. 2. Bilateral lower lobe patchy consolidative changes may represent atelectasis or pneumonia versus areas of developing infarct. 3. Right inguinal canal hematoma similar or slightly decreased since the prior CT. No retroperitoneal bleed. 4. Sigmoid diverticulosis. No bowel obstruction. Normal appendix. 5.  Aortic Atherosclerosis (ICD10-I70.0). These results will be called to the  ordering clinician or representative by the Radiologist Assistant, and communication documented in the PACS or [employer]. Electronically Signed   By: Vanetta Chou M.D.   On: 01/17/2025 16:52     Medical Consultants:   None.   Subjective:    Clayten Allcock denies any chest pain or shortness of breath  Objective:    Vitals:   01/18/25 2044 01/18/25 2201 01/19/25 0037  01/19/25 0543  BP: 116/83 114/84 130/82 130/88  Pulse: (!) 116 (!) 111 (!) 112 99  Resp: 20  20 20   Temp: 98.1 F (36.7 C)  98.1 F (36.7 C) 98.1 F (36.7 C)  TempSrc: Oral  Oral Oral  SpO2: 95%  98% 99%  Weight:      Height:       SpO2: 99 % O2 Flow Rate (L/min): 3 L/min   Intake/Output Summary (Last 24 hours) at 01/19/2025 0730 Last data filed at 01/19/2025 0600 Gross per 24 hour  Intake --  Output 1725 ml  Net -1725 ml   Filed Weights   01/10/25 1400 01/11/25 2035  Weight: 81.6 kg 81.6 kg    Exam: General exam: In no acute distress. Respiratory system: Good air movement and clear to auscultation. Cardiovascular system: S1 & S2 heard, RRR. No JVD. Gastrointestinal system: Abdomen is nondistended, soft and nontender.  Extremities: No pedal edema. Skin: No rashes, lesions or ulcers Psychiatry: Judgement and insight appear normal. Mood & affect appropriate.    Data Reviewed:    Labs: Basic Metabolic Panel: Recent Labs  Lab 01/14/25 0315 01/15/25 0248 01/16/25 0424 01/18/25 0404 01/19/25 0405  NA 141 138 139 135 133*  K 3.6 3.6 3.6 3.9 3.7  CL 108 102 104 99 98  CO2 23 23 24 22 22   GLUCOSE 107* 113* 111* 146* 140*  BUN 13 15 13 20  40*  CREATININE 0.81 0.75 0.74 0.95 1.06  CALCIUM  8.9 8.9 9.1 9.5 9.3   GFR Estimated Creatinine Clearance: 84.4 mL/min (by C-G formula based on SCr of 1.06 mg/dL). Liver Function Tests: Recent Labs  Lab 01/19/25 0405  AST 26  ALT 16  ALKPHOS 108  BILITOT 0.9  PROT 7.1  ALBUMIN 3.5   No results for input(s): LIPASE, AMYLASE in the last 168 hours. No results for input(s): AMMONIA in the last 168 hours. Coagulation profile No results for input(s): INR, PROTIME in the last 168 hours. COVID-19 Labs  No results for input(s): DDIMER, FERRITIN, LDH, CRP in the last 72 hours.  No results found for: SARSCOV2NAA  CBC: Recent Labs  Lab 01/15/25 0909 01/16/25 0424 01/18/25 0404 01/18/25 1202  01/18/25 1403 01/19/25 0405  WBC 11.6* 12.7* 33.2* 33.1* 33.5* 33.0*  NEUTROABS 7.7 8.0*  --   --  28.2*  --   HGB 13.1 12.8* 13.4 13.6 13.7 12.0*  HCT 36.3* 35.6* 38.2* 37.3* 38.5* 33.5*  MCV 93.6 94.9 94.6 91.6 93.4 93.8  PLT 280 282 366 395 395 379   Cardiac Enzymes: No results for input(s): CKTOTAL, CKMB, CKMBINDEX, TROPONINI in the last 168 hours. BNP (last 3 results) No results for input(s): PROBNP in the last 8760 hours. CBG: No results for input(s): GLUCAP in the last 168 hours. D-Dimer: No results for input(s): DDIMER in the last 72 hours. Hgb A1c: No results for input(s): HGBA1C in the last 72 hours. Lipid Profile: No results for input(s): CHOL, HDL, LDLCALC, TRIG, CHOLHDL, LDLDIRECT in the last 72 hours. Thyroid  function studies: No results for input(s): TSH, T4TOTAL, T3FREE, THYROIDAB in the  last 72 hours.  Invalid input(s): FREET3 Anemia work up: No results for input(s): VITAMINB12, FOLATE, FERRITIN, TIBC, IRON, RETICCTPCT in the last 72 hours. Sepsis Labs: Recent Labs  Lab 01/18/25 0404 01/18/25 1202 01/18/25 1403 01/19/25 0405  PROCALCITON  --  0.88  --   --   WBC 33.2* 33.1* 33.5* 33.0*   Microbiology Recent Results (from the past 240 hours)  MRSA Next Gen by PCR, Nasal     Status: None   Collection Time: 01/10/25  7:39 PM   Specimen: Nasal Mucosa; Nasal Swab  Result Value Ref Range Status   MRSA by PCR Next Gen NOT DETECTED NOT DETECTED Final    Comment: (NOTE) The GeneXpert MRSA Assay (FDA approved for NASAL specimens only), is one component of a comprehensive MRSA colonization surveillance program. It is not intended to diagnose MRSA infection nor to guide or monitor treatment for MRSA infections. Test performance is not FDA approved in patients less than 12 years old. Performed at [facility], [address] [place], [place] [address]      Medications:    acetaminophen   1,000 mg  Oral QID   apixaban   10 mg Oral BID   Followed by   NOREEN ON 01/24/2025] apixaban   5 mg Oral BID   aspirin   81 mg Oral Daily   atorvastatin   80 mg Oral Daily   azithromycin   500 mg Oral Daily   clopidogrel   75 mg Oral Daily   gabapentin   300 mg Oral TID   lidocaine   1 patch Transdermal Q24H   losartan   25 mg Oral Daily   magnesium  citrate  1 Bottle Oral Once   melatonin  3 mg Oral QHS   methocarbamol   1,000 mg Oral TID   metoprolol  succinate  50 mg Oral QHS   pantoprazole   40 mg Oral Daily   polyethylene glycol  17 g Oral Daily   senna-docusate  1 tablet Oral QHS   sodium chloride  flush  3 mL Intravenous Q12H   Continuous Infusions:  cefTRIAXone  (ROCEPHIN )  IV 1 g (01/18/25 1420)      LOS: 9 days   Erle Odell Castor  [facility]  01/19/2025, 7:30 AM

## 2025-01-19 NOTE — Plan of Care (Signed)
" °  Problem: Clinical Measurements: Goal: Will remain free from infection Outcome: Progressing   Problem: Clinical Measurements: Goal: Diagnostic test results will improve Outcome: Progressing   Problem: Clinical Measurements: Goal: Respiratory complications will improve Outcome: Progressing   Problem: Clinical Measurements: Goal: Cardiovascular complication will be avoided Outcome: Progressing   Problem: Activity: Goal: Risk for activity intolerance will decrease Outcome: Progressing   Problem: Nutrition: Goal: Adequate nutrition will be maintained Outcome: Progressing   Problem: Coping: Goal: Level of anxiety will decrease Outcome: Progressing   Problem: Pain Managment: Goal: General experience of comfort will improve and/or be controlled Outcome: Progressing   Problem: Safety: Goal: Ability to remain free from injury will improve Outcome: Progressing   Problem: Skin Integrity: Goal: Risk for impaired skin integrity will decrease Outcome: Progressing   "

## 2025-01-19 NOTE — Progress Notes (Signed)
 "  Progress Note  Patient Name: Aaron [name] Date of Encounter: 01/19/2025 [facility] Cardiologist: Arun K Thukkani, MD   Interval Summary    Patient notes significant improvement in groin pain. Denies SOB or chest pain.   Vital Signs Vitals:   01/18/25 2201 01/19/25 0037 01/19/25 0543 01/19/25 0813  BP: 114/84 130/82 130/88 (!) 127/91  Pulse: (!) 111 (!) 112 99 (!) 112  Resp:  20 20 19   Temp:  98.1 F (36.7 C) 98.1 F (36.7 C) 98.1 F (36.7 C)  TempSrc:  Oral Oral Oral  SpO2:  98% 99% 98%  Weight:      Height:        Intake/Output Summary (Last 24 hours) at 01/19/2025 0955 Last data filed at 01/19/2025 9073 Gross per 24 hour  Intake 360 ml  Output 2125 ml  Net -1765 ml      01/11/2025    8:35 PM 01/10/2025    2:00 PM 05/05/2021   10:31 AM  Last 3 Weights  Weight (lbs) 180 lb 180 lb 205 lb  Weight (kg) 81.647 kg 81.647 kg 92.987 kg      Telemetry/ECG   Sinus tachycardia, rates improved 100-110 - Personally Reviewed  Physical Exam  GEN: No acute distress.   Neck: No JVD Cardiac: tachycardic, no murmurs, rubs, or gallops.  Respiratory: Diminished in bases  GI: Soft, nontender, non-distended  MS: No edema  Assessment & Plan   59 y.o. male with PMH of hemorrhoid and recent tooth infection who was seen 01/10/2025 for the evaluation of STEMI.    Anterior STEMI -- Underwent cardiac catheterization 1/21 with 100% thrombotic occluded mid LAD treated with PCI/DES x 1.  Recommendations for DAPT with aspirin /Brilinta  for at least 1 year.  Since he will need high dose OAC for PE I would recommend stopping ASA at [facility] Plavix  for one year.  -- Continue atorvastatin  80 mg daily.    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  -- Repeat CT 1/28 with right inguinal canal hematoma similar slightly decreased since prior.  No retroperitoneal bleed -- Site remains stable. Fortunately pain control is much better.    Leukocytosis -- WBC elevated at 33 this morning, no fevers overnight -- CT chest with bilateral lower lobe patchy consolidative changes with possible atelectasis versus pneumonia -- on empiric antibiotics per IM   Right renal nodule -- noted on CT, likely benign. Follow up with PCP    Deconditioning: hopefully with better pain control he will be able to make progress with PT/OT but feel he would benefit from Rehab.    For questions or updates, please contact [facility] Please consult [email or web address] for contact info under  Signed, Aaron Jerome, MD   "

## 2025-01-19 NOTE — Progress Notes (Addendum)
 Physical Therapy Treatment Patient Details Name: Aaron [name] MRN: [id number] DOB: 1966-04-17 Today's Date: 01/19/2025   History of Present Illness Pt is a 59 y.o. male who presented 121/26 due to chest pain/STEMI. 1/21 cardiac catheterization. Pt also with R groin/inguinal canal hematoma w/ edema. 1/28 CT chest confirmed B PE likely related to femoral vein thrombosis. PMH: hemorrhoid, recent tooth infection    PT Comments  Pt made significant progress towards acute PT goals despite dyspnea on exertion and pain in groin. Pt was able to perform bed mobility with CGA/supervision. With initial sit<>stand repetitions, pt required CGA for safety with use of RW. When fatigued, pt required up to ModA to boost-up. Able to ambulate two sets of 43ft with MinA and +2 for chair follow. PT assisting with pacing as pt had difficulty gauging when to stop and rest. Ended session with pt performing hygiene tasks in sitting and standing. Continue to recommend <3hrs post acute rehab to return to prior level of independence. Will continue to follow acutely.  92% SpO2 on RA at rest 84-92% SpO2 on RA during mobility 110s BPM at rest 110-137 BPM during gait    If plan is discharge home, recommend the following: A lot of help with walking and/or transfers;A lot of help with bathing/dressing/bathroom;Assistance with cooking/housework;Help with stairs or ramp for entrance;Assist for transportation   Can travel by private vehicle     No  Equipment Recommendations  Rolling walker (2 wheels);Wheelchair (measurements PT);Wheelchair cushion (measurements PT);BSC/3in1       Precautions / Restrictions Precautions Precautions: Fall;Other (comment) Recall of Precautions/Restrictions: Intact Precaution/Restrictions Comments: watch HR Restrictions Weight Bearing Restrictions Per Provider Order: No     Mobility  Bed Mobility Overal bed mobility: Needs Assistance Bed Mobility: Supine to Sit, Sit to Supine    Supine  to sit: Supervision, HOB elevated, Used rails Sit to supine: Contact guard assist, HOB elevated, Used rails   General bed mobility comments: CGA for return to supine with increased time/effort due to pain in scrotum    Transfers Overall transfer level: Needs assistance Equipment used: Rolling walker (2 wheels) Transfers: Sit to/from Stand Sit to Stand: Contact guard assist, Mod assist    General transfer comment: CGA for initial repetitions with progression to ModA when having increased pain in scrotum    Ambulation/Gait Ambulation/Gait assistance: Min assist, +2 safety/equipment, Contact guard assist Gait Distance (Feet): 80 Feet (x2) Assistive device: Rolling walker (2 wheels) Gait Pattern/deviations: Step-through pattern, Decreased stride length, Wide base of support, Trunk flexed Gait velocity: decreased    General Gait Details: CGA/MinA for slight steadying assist with wide BOS 2/2 pain in scrotum. Close chair follow      Balance Overall balance assessment: Needs assistance Sitting-balance support: Feet supported, No upper extremity supported Sitting balance-Leahy Scale: Fair     Standing balance support: Bilateral upper extremity supported, During functional activity, Reliant on assistive device for balance Standing balance-Leahy Scale: Poor Standing balance comment: reliant on UE support     Communication Communication Communication: No apparent difficulties  Cognition Arousal: Alert Behavior During Therapy: WFL for tasks assessed/performed, Flat affect   PT - Cognitive impairments: No apparent impairments    Following commands: Intact      Cueing Cueing Techniques: Verbal cues         Pertinent Vitals/Pain Pain Assessment Pain Assessment: Faces Faces Pain Scale: Hurts whole lot Pain Location: scrotum, R groin Pain Descriptors / Indicators: Grimacing, Discomfort, Guarding, Tender Pain Intervention(s): Limited activity within patient's tolerance,  Premedicated before  session, Monitored during session, Repositioned     PT Goals (current goals can now be found in the care plan section) Acute Rehab PT Goals PT Goal Formulation: With patient Time For Goal Achievement: 01/30/25 Potential to Achieve Goals: Good Progress towards PT goals: Progressing toward goals    Frequency    Min 2X/week           Co-evaluation   Reason for Co-Treatment: For patient/therapist safety;To address functional/ADL transfers PT goals addressed during session: Mobility/safety with mobility;Balance;Proper use of DME        AM-PAC PT 6 Clicks Mobility   Outcome Measure  Help needed turning from your back to your side while in a flat bed without using bedrails?: A Little Help needed moving from lying on your back to sitting on the side of a flat bed without using bedrails?: A Little Help needed moving to and from a bed to a chair (including a wheelchair)?: A Lot Help needed standing up from a chair using your arms (e.g., wheelchair or bedside chair)?: A Lot Help needed to walk in hospital room?: A Little Help needed climbing 3-5 steps with a railing? : Total 6 Click Score: 14    End of Session Equipment Utilized During Treatment: Gait belt Activity Tolerance: Patient tolerated treatment well Patient left: in bed;with call bell/phone within reach;with bed alarm set;with family/visitor present Nurse Communication: Mobility status PT Visit Diagnosis: Difficulty in walking, not elsewhere classified (R26.2);Muscle weakness (generalized) (M62.81);Other abnormalities of gait and mobility (R26.89)     Time: 9045-8967 PT Time Calculation (min) (ACUTE ONLY): 38 min  Charges:    $Gait Training: 8-22 mins PT General Charges $$ ACUTE PT VISIT: 1 Visit                     Aaron [name], PT, DPT Secure Chat Preferred  Rehab Office [phone]   Aaron [name] Aaron [name] 01/19/2025, 11:22 AM

## 2025-01-19 NOTE — TOC Progression Note (Addendum)
 Transition of Care [facility]) - Progression Note    Patient Details  Name: Aaron [name] MRN: [id number] Date of Birth: 03-Jan-1966  Transition of [facility]) CM/SW Contact  Isaiah Public, LCSWA Phone Number: 01/19/2025, 2:05 PM  Clinical Narrative:     Phill with financial counseling informed CSW that patients coverage is active for the entire month of January and February. Saprese informed CSW that she unable to go beyond February until we are in the month of February but at that time, she can recheck for March to see if it will term then, or if its still active and go from there. CSW will update patient and patients Goddaughter Aaron [name] with this information  Update- CSW LVM for patients daughter Aaron [name]. CSW awaiting call back.  Update- CSW updated patient and with patients permission patients mother at bedside. Patient informed CSW he got in touch with his case worker who confirmed his insurance is good through August. Patient plans to stay in contact with his case worker to help him re enroll when its time.  Expected Discharge Plan: OP Rehab Barriers to Discharge: No Barriers Identified               Expected Discharge Plan and Services In-house Referral: Clinical Social Work Discharge Planning Services: CM Consult Post Acute Care Choice: NA Living arrangements for the past 2 months: Single Family Home                   DME Agency: NA                   Social Drivers of Health (SDOH) Interventions SDOH Screenings   Food Insecurity: No Food Insecurity (01/11/2025)  Housing: Low Risk (01/11/2025)  Transportation Needs: No Transportation Needs (01/11/2025)  Utilities: Not At Risk (01/11/2025)  Tobacco Use: High Risk (01/11/2025)    Readmission Risk Interventions     No data to display

## 2025-01-20 DIAGNOSIS — E785 Hyperlipidemia, unspecified: Secondary | ICD-10-CM | POA: Diagnosis present

## 2025-01-20 DIAGNOSIS — I5021 Acute systolic (congestive) heart failure: Secondary | ICD-10-CM | POA: Diagnosis present

## 2025-01-20 LAB — BASIC METABOLIC PANEL WITH GFR
Anion gap: 13 (ref 5–15)
BUN: 31 mg/dL — ABNORMAL HIGH (ref 6–20)
CO2: 24 mmol/L (ref 22–32)
Calcium: 9.7 mg/dL (ref 8.9–10.3)
Chloride: 99 mmol/L (ref 98–111)
Creatinine, Ser: 0.96 mg/dL (ref 0.61–1.24)
GFR, Estimated: >60 mL/min
Glucose, Bld: 120 mg/dL — ABNORMAL HIGH (ref 70–99)
Potassium: 4.3 mmol/L (ref 3.5–5.1)
Sodium: 137 mmol/L (ref 135–145)

## 2025-01-20 LAB — CBC
HCT: 35.7 % — ABNORMAL LOW (ref 39.0–52.0)
Hemoglobin: 12.8 g/dL — ABNORMAL LOW (ref 13.0–17.0)
MCH: 33.7 pg (ref 26.0–34.0)
MCHC: 35.9 g/dL (ref 30.0–36.0)
MCV: 93.9 fL (ref 80.0–100.0)
Platelets: 440 10*3/uL — ABNORMAL HIGH (ref 150–400)
RBC: 3.8 MIL/uL — ABNORMAL LOW (ref 4.22–5.81)
RDW: 11.5 % (ref 11.5–15.5)
WBC: 31.9 10*3/uL — ABNORMAL HIGH (ref 4.0–10.5)
nRBC: 0 % (ref 0.0–0.2)

## 2025-01-20 NOTE — Plan of Care (Signed)
" °  Problem: Education: Goal: Knowledge of General Education information will improve Description: Including pain rating scale, medication(s)/side effects and non-pharmacologic comfort measures Outcome: Progressing   Problem: Clinical Measurements: Goal: Ability to maintain clinical measurements within normal limits will improve Outcome: Progressing   Problem: Clinical Measurements: Goal: Will remain free from infection Outcome: Progressing   Problem: Clinical Measurements: Goal: Diagnostic test results will improve Outcome: Progressing   Problem: Clinical Measurements: Goal: Respiratory complications will improve Outcome: Progressing   Problem: Activity: Goal: Risk for activity intolerance will decrease Outcome: Progressing   Problem: Clinical Measurements: Goal: Cardiovascular complication will be avoided Outcome: Progressing   Problem: Coping: Goal: Level of anxiety will decrease Outcome: Progressing   Problem: Elimination: Goal: Will not experience complications related to bowel motility Outcome: Progressing   Problem: Pain Managment: Goal: General experience of comfort will improve and/or be controlled Outcome: Progressing   Problem: Safety: Goal: Ability to remain free from injury will improve Outcome: Progressing   Problem: Skin Integrity: Goal: Risk for impaired skin integrity will decrease Outcome: Progressing   Problem: Activity: Goal: Ability to return to baseline activity level will improve Outcome: Progressing   "

## 2025-01-20 NOTE — Plan of Care (Signed)
" °  Problem: Education: Goal: Knowledge of General Education information will improve Description: Including pain rating scale, medication(s)/side effects and non-pharmacologic comfort measures Outcome: Progressing   Problem: Health Behavior/Discharge Planning: Goal: Ability to manage health-related needs will improve Outcome: Progressing   Problem: Clinical Measurements: Goal: Ability to maintain clinical measurements within normal limits will improve Outcome: Progressing Goal: Will remain free from infection Outcome: Progressing Goal: Diagnostic test results will improve Outcome: Progressing Goal: Respiratory complications will improve Outcome: Progressing Goal: Cardiovascular complication will be avoided Outcome: Progressing   Problem: Activity: Goal: Risk for activity intolerance will decrease Outcome: Progressing   Problem: Nutrition: Goal: Adequate nutrition will be maintained Outcome: Progressing   Problem: Coping: Goal: Level of anxiety will decrease Outcome: Progressing   Problem: Elimination: Goal: Will not experience complications related to bowel motility Outcome: Progressing Goal: Will not experience complications related to urinary retention Outcome: Progressing   Problem: Pain Managment: Goal: General experience of comfort will improve and/or be controlled Outcome: Progressing   Problem: Safety: Goal: Ability to remain free from injury will improve Outcome: Progressing   Problem: Skin Integrity: Goal: Risk for impaired skin integrity will decrease Outcome: Progressing   Problem: Education: Goal: Understanding of CV disease, CV risk reduction, and recovery process will improve Outcome: Progressing Goal: Individualized Educational Video(s) Outcome: Progressing   Problem: Activity: Goal: Ability to return to baseline activity level will improve Outcome: Progressing   Problem: Cardiovascular: Goal: Ability to achieve and maintain adequate  cardiovascular perfusion will improve Outcome: Progressing Goal: Vascular access site(s) Level 0-1 will be maintained Outcome: Progressing   Problem: Health Behavior/Discharge Planning: Goal: Ability to safely manage health-related needs after discharge will improve Outcome: Progressing   Problem: Education: Goal: Knowledge of General Education information will improve Description: Including pain rating scale, medication(s)/side effects and non-pharmacologic comfort measures Outcome: Progressing   Problem: Health Behavior/Discharge Planning: Goal: Ability to manage health-related needs will improve Outcome: Progressing   Problem: Clinical Measurements: Goal: Ability to maintain clinical measurements within normal limits will improve Outcome: Progressing Goal: Will remain free from infection Outcome: Progressing Goal: Diagnostic test results will improve Outcome: Progressing Goal: Respiratory complications will improve Outcome: Progressing Goal: Cardiovascular complication will be avoided Outcome: Progressing   Problem: Activity: Goal: Risk for activity intolerance will decrease Outcome: Progressing   Problem: Nutrition: Goal: Adequate nutrition will be maintained Outcome: Progressing   Problem: Coping: Goal: Level of anxiety will decrease Outcome: Progressing   Problem: Elimination: Goal: Will not experience complications related to bowel motility Outcome: Progressing Goal: Will not experience complications related to urinary retention Outcome: Progressing   Problem: Pain Managment: Goal: General experience of comfort will improve and/or be controlled Outcome: Progressing   Problem: Safety: Goal: Ability to remain free from injury will improve Outcome: Progressing   Problem: Skin Integrity: Goal: Risk for impaired skin integrity will decrease Outcome: Progressing   Problem: Education: Goal: Understanding of CV disease, CV risk reduction, and recovery  process will improve Outcome: Progressing Goal: Individualized Educational Video(s) Outcome: Progressing   Problem: Activity: Goal: Ability to return to baseline activity level will improve Outcome: Progressing   Problem: Cardiovascular: Goal: Ability to achieve and maintain adequate cardiovascular perfusion will improve Outcome: Progressing Goal: Vascular access site(s) Level 0-1 will be maintained Outcome: Progressing   Problem: Health Behavior/Discharge Planning: Goal: Ability to safely manage health-related needs after discharge will improve Outcome: Progressing   "

## 2025-01-20 NOTE — Progress Notes (Signed)
"  °  Progress Note  Patient Name: Aaron [name] Date of Encounter: 01/20/2025 [facility] Cardiologist: Arun K Thukkani, MD   Interval Summary   No acute overnight events. Patient reports feeling relatively well. No new or acute complaints.   Vital Signs Vitals:   01/19/25 2126 01/19/25 2321 01/20/25 0530 01/20/25 0819  BP: 107/80 123/82 127/78 118/89  Pulse: (!) 104 (!) 106 (!) 104 (!) 117  Resp:  20 16 18   Temp:  97.8 F (36.6 C) 98.2 F (36.8 C) 97.8 F (36.6 C)  TempSrc:  Oral Oral Oral  SpO2:  95% 98% 91%  Weight:      Height:        Intake/Output Summary (Last 24 hours) at 01/20/2025 0910 Last data filed at 01/19/2025 2300 Gross per 24 hour  Intake 450 ml  Output 1050 ml  Net -600 ml      01/11/2025    8:35 PM 01/10/2025    2:00 PM 05/05/2021   10:31 AM  Last 3 Weights  Weight (lbs) 180 lb 180 lb 205 lb  Weight (kg) 81.647 kg 81.647 kg 92.987 kg      Telemetry/ECG  SR/ST - Personally Reviewed  Physical Exam  General: Well developed, in no acute distress.  Neck: No JVD.  Cardiac: Normal rate, regular rhythm.  Resp: Normal work of breathing.  Ext: No edema. R groin hematoma. Neuro: No gross focal deficits.  Psych: Normal affect.   Assessment & Plan  Anterior STEMI: Underwent cardiac catheterization 1/21 with 100% thrombotic occluded mid LAD treated with PCI/DES x 1. -Initial recommendations were for DAPT with aspirin /Brilinta  for at least 1 year.  Since he now needs OAC for PE, he has been transitioned to aspirin  and Plavix , with plans to stop aspirin  on discharge. -Continue atorvastatin  80 mg daily.     Bilateral PE: Developed hypoxia and tachycardia CT angio 1/28 with bilateral pulmonary artery emboli, no evidence of right heart strain LLE DVT: -Started on Eliquis  10mg  BID x 7 days, then transition to 5mg  BID.   Acute HFmrEF: Echo 12/2020 with LVEF of 45 to 50%, normal RV, hypokinesis of the mid/distal anterior septum, inferior septal and  apex -- LVEDP , volume stable on exam -- GDMT: continue Toprol  25mg  daily, losartan  25mg  daily  -- Repeat Echo with EF stable 45-50%   Hyperlipidemia -- LDL 82, HDL 35, LP(a) 121 -- continue atorvastatin  80mg  daily -- will need FLP/LFTs in 8 weeks    Right groin hematoma/scrotal edema -- CT abdomen pelvis with no evidence for retroperitoneal hemorrhage, 6.5 x 4.7 x 11 cm right groin/inguinal canal hematoma with edema.  -- Repeat CT 1/28 with right inguinal canal hematoma similar slightly decreased since prior.  No retroperitoneal bleed -- Site remains stable. No pain on palpation today.    Leukocytosis -- WBC elevated at 32 this morning, no fevers overnight. -- CT chest with bilateral lower lobe patchy consolidative changes with possible atelectasis versus pneumonia. -- on empiric antibiotics per IM, appreciate assistance   Right renal nodule -- noted on CT, likely benign. Follow up with PCP     Signed, Fonda Kitty, MD   "

## 2025-01-20 NOTE — Progress Notes (Incomplete)
 Aaron [name]

## 2025-01-21 DIAGNOSIS — I2102 ST elevation (STEMI) myocardial infarction involving left anterior descending coronary artery: Secondary | ICD-10-CM | POA: Diagnosis not present

## 2025-01-21 DIAGNOSIS — S3012XA Contusion of groin, initial encounter: Secondary | ICD-10-CM | POA: Diagnosis not present

## 2025-01-21 DIAGNOSIS — I2699 Other pulmonary embolism without acute cor pulmonale: Secondary | ICD-10-CM

## 2025-01-21 LAB — BASIC METABOLIC PANEL WITH GFR
Anion gap: 15 (ref 5–15)
BUN: 28 mg/dL — ABNORMAL HIGH (ref 6–20)
CO2: 23 mmol/L (ref 22–32)
Calcium: 9.1 mg/dL (ref 8.9–10.3)
Chloride: 97 mmol/L — ABNORMAL LOW (ref 98–111)
Creatinine, Ser: 0.85 mg/dL (ref 0.61–1.24)
GFR, Estimated: >60 mL/min
Glucose, Bld: 137 mg/dL — ABNORMAL HIGH (ref 70–99)
Potassium: 4.1 mmol/L (ref 3.5–5.1)
Sodium: 135 mmol/L (ref 135–145)

## 2025-01-21 LAB — CBC
HCT: 34 % — ABNORMAL LOW (ref 39.0–52.0)
Hemoglobin: 11.9 g/dL — ABNORMAL LOW (ref 13.0–17.0)
MCH: 32.7 pg (ref 26.0–34.0)
MCHC: 35 g/dL (ref 30.0–36.0)
MCV: 93.4 fL (ref 80.0–100.0)
Platelets: 466 10*3/uL — ABNORMAL HIGH (ref 150–400)
RBC: 3.64 MIL/uL — ABNORMAL LOW (ref 4.22–5.81)
RDW: 11.6 % (ref 11.5–15.5)
WBC: 28.5 10*3/uL — ABNORMAL HIGH (ref 4.0–10.5)
nRBC: 0 % (ref 0.0–0.2)

## 2025-01-21 MED ORDER — HYDROCODONE-ACETAMINOPHEN 5-325 MG PO TABS: 2.0000 | ORAL_TABLET | ORAL | Status: DC | PRN

## 2025-01-21 MED ORDER — OXYCODONE HCL 5 MG PO TABS
10.0000 mg | ORAL_TABLET | ORAL | Status: DC | PRN
  Administered 2025-01-21 – 2025-01-23 (×6): 10 mg via ORAL
  Filled 2025-01-21 (×6): qty 2

## 2025-01-21 MED ORDER — AMOXICILLIN-POT CLAVULANATE 875-125 MG PO TABS
1.0000 | ORAL_TABLET | Freq: Two times a day (BID) | ORAL | Status: AC
  Administered 2025-01-21 (×2): 1 via ORAL
  Filled 2025-01-21 (×2): qty 1

## 2025-01-21 MED ORDER — DOCUSATE SODIUM 283 MG RE ENEM
1.0000 | ENEMA | Freq: Once | RECTAL | Status: AC
  Administered 2025-01-21: 283 mg via RECTAL
  Filled 2025-01-21: qty 1

## 2025-01-21 NOTE — Plan of Care (Signed)

## 2025-01-21 NOTE — Plan of Care (Signed)

## 2025-01-21 NOTE — Progress Notes (Signed)
" °   01/20/25 2024  Assess: MEWS Score  Temp 98.4 F (36.9 C)  BP 129/87  MAP (mmHg) 100  Pulse Rate (!) 120  ECG Heart Rate (!) 122  Resp 20  SpO2 98 %  O2 Device Room Air  Assess: MEWS Score  MEWS Temp 0  MEWS Systolic 0  MEWS Pulse 2  MEWS RR 0  MEWS LOC 0  MEWS Score 2  MEWS Score Color Yellow  Assess: if the MEWS score is Yellow or Red  Were vital signs accurate and taken at a resting state? Yes  Does the patient meet 2 or more of the SIRS criteria? No  Notify: Charge Nurse/RN  Name of Charge Nurse/RN Notified Insurance Claims Handler  Provider Notification  Provider Name/Title Opyd MD  Date Provider Notified 01/21/25  Time Provider Notified 2244  Method of Notification Page  Notification Reason Other (Comment) (Patient o2 saturation decreased and severe pain)  Provider response No new orders  Date of Provider Response 01/20/25  Time of Provider Response 2245  Assess: SIRS CRITERIA  SIRS Temperature  0  SIRS Respirations  0  SIRS Pulse 1  SIRS WBC 0  SIRS Score Sum  1    "

## 2025-01-21 NOTE — Progress Notes (Signed)
 Patient has has been on room air with a saturation of 97% and now have a saturation of 88% on room air. Patient is requiring 2L of nasal cannula. The saturation is now 96%. Opyd MD made aware. No new orders at this time.

## 2025-01-21 NOTE — Progress Notes (Signed)
"  °  Progress Note  Patient Name: Aaron [name] Date of Encounter: 01/21/2025  [facility] Cardiologist: Arun K Thukkani, MD   Interval Summary   No acute overnight events. Patient till with back pain. No new or acute complaints.   Vital Signs Vitals:   01/20/25 1500 01/20/25 2024 01/20/25 2148 01/21/25 0037  BP: 115/71 129/87  133/83  Pulse: (!) 116 (!) 120  (!) 104  Resp: 18 20  16   Temp: 98.1 F (36.7 C) 98.4 F (36.9 C)  98.1 F (36.7 C)  TempSrc: Oral Oral  Oral  SpO2: 97% 98% 90% 97%  Weight:      Height:        Intake/Output Summary (Last 24 hours) at 01/21/2025 0923 Last data filed at 01/21/2025 0514 Gross per 24 hour  Intake 360 ml  Output 1050 ml  Net -690 ml      01/11/2025    8:35 PM 01/10/2025    2:00 PM 05/05/2021   10:31 AM  Last 3 Weights  Weight (lbs) 180 lb 180 lb 205 lb  Weight (kg) 81.647 kg 81.647 kg 92.987 kg      Telemetry/ECG  ST - Personally Reviewed  Physical Exam  General: Well developed, in no acute distress.  Neck: No JVD.  Cardiac: Normal rate, regular rhythm.  Resp: Normal work of breathing.  Ext: No edema. R groin hematoma, nontender to palpation. Neuro: No gross focal deficits.  Psych: Normal affect.   Assessment & Plan  Anterior STEMI: Underwent cardiac catheterization 1/21 with 100% thrombotic occluded mid LAD treated with PCI/DES x 1. -Initial recommendations were for DAPT with aspirin /Brilinta  for at least 1 year.  Since he now needs OAC for PE, he has been transitioned to aspirin  and Plavix , with plans to stop aspirin  on discharge. -Continue atorvastatin  80 mg daily.     Bilateral PE: Developed hypoxia and tachycardia CT angio 1/28 with bilateral pulmonary artery emboli, no evidence of right heart strain LLE DVT: -Started on Eliquis  10mg  BID x 7 days, then transition to 5mg  BID.   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 -- CT abdomen pelvis with no evidence for retroperitoneal hemorrhage, 6.5 x 4.7 x 11 cm right groin/inguinal canal hematoma with edema.  -- Repeat CT 1/28 with right inguinal canal hematoma similar slightly decreased since prior.  No retroperitoneal bleed -- Site remains stable. No pain on palpation.    Leukocytosis: Improving.  -- WBC decreased to 28 this morning. Afebrile. Reactionary vs infection. -- CT chest with bilateral lower lobe patchy consolidative changes with possible atelectasis versus pneumonia. -- on empiric antibiotics per IM, appreciate assistance   Right renal nodule -- noted on CT, likely benign. Follow up with PCP    Ultimately will likely need rehab placement. Discharge pending this.  Signed, Fonda Kitty, MD   "

## 2025-01-21 NOTE — Progress Notes (Signed)
 [facility] CONSULT PROGRESS NOTE    Progress Note  Ronnie Doo  FMW:[id number] DOB: 1966/08/26 DOA: 01/10/2025 PCP: Patient, No Pcp Per     Brief Narrative:   Hershell Brandl is an 59 y.o. male past medical history of HFrEF, admitted by cardiology for NSTEMI on 01/10/2025 status post PCI we are consulted for pulmonary infarction on imaging secondary to bilateral PE.  Assessment/Plan:   STEMI involving left anterior descending coronary artery [facility]) Admitted and stented by cardiology on 01/10/2025. Currently on DAPT therapy aspirin  and Plavix . Also on statins. Further management per cardiology. Will need skilled nursing facility placement.  Acute bilateral PE: Appreciated on CT angio of the chest on 01/17/2025, no right heart strain.  With possible developing infarct. Continues to have some pleuritic chest pain increase and change narcotics. Agree with Eliquis . Now on triple therapy aspirin , Plavix  and Eliquis  Pain is improved denies any shortness of breath. Will continue to follow at a distance.  Leukocytosis: Is probably acting as an acute phase reactant. Leukocytosis is probably due to pulmonary infarction, as he has remained afebrile. Unlikely an infectious etiology but will finish a 5-day course of antibiotics. He is satting greater 96% on room air. Out of bed to chair continue sensi spirometry.  Hyperlipidemia:  continue statins.  Acute HFrEF: 2D echo showed an EF of 45% GDMT on Toprol  and losartan . Further management per cardiology.  Right groin hematoma: Hemoglobin stable around 12-13, continue to monitor closely. He is on triple therapy.  Right renal nodule: Noted on CT will need to follow-up with CT with a follow-up imaging.  Disposition: Patient ambulation was limited with PT and has had some very minimal improvement. Patient agreed with me this morning to go to skilled nursing facility.  Right groin hematoma: Currently on aspirin  Plavix  and  Eliquis . Further management per cardiology.   DVT prophylaxis: Eliquis  Family Communication:wife Status is: Inpatient Remains inpatient appropriate because: Acute STEMI and bilateral PEs    Code Status:     Code Status Orders  (From admission, onward)           Start     Ordered   01/10/25 1712  Full code  Continuous       Question:  By:  Answer:  Consent: discussion documented in EHR   01/10/25 1711           Code Status History     This patient has a current code status but no historical code status.         IV Access:   Peripheral IV   Procedures and diagnostic studies:   No results found.    Medical Consultants:   None.   Subjective:    Pearley Palin having some pleuritic chest pain shortness of breath is resolved.  Objective:    Vitals:   01/20/25 1500 01/20/25 2024 01/20/25 2148 01/21/25 0037  BP: 115/71 129/87  133/83  Pulse: (!) 116 (!) 120  (!) 104  Resp: 18 20  16   Temp: 98.1 F (36.7 C) 98.4 F (36.9 C)  98.1 F (36.7 C)  TempSrc: Oral Oral  Oral  SpO2: 97% 98% 90% 97%  Weight:      Height:       SpO2: 97 % O2 Flow Rate (L/min): 3 L/min   Intake/Output Summary (Last 24 hours) at 01/21/2025 0910 Last data filed at 01/21/2025 0514 Gross per 24 hour  Intake 360 ml  Output 1050 ml  Net -690 ml   [employer]  01/10/25 1400 01/11/25 2035  Weight: 81.6 kg 81.6 kg    Exam: General exam: In no acute distress. Respiratory system: Good air movement and clear to auscultation. Cardiovascular system: S1 & S2 heard, RRR. No JVD. Gastrointestinal system: Abdomen is nondistended, soft and nontender.  Extremities: No pedal edema. Skin: No rashes, lesions or ulcers Psychiatry: Judgement and insight appear normal. Mood & affect appropriate.   Data Reviewed:    Labs: Basic Metabolic Panel: Recent Labs  Lab 01/16/25 0424 01/18/25 0404 01/19/25 0405 01/20/25 0424 01/21/25 0242  NA 139 135 133* 137 135  K 3.6 3.9  3.7 4.3 4.1  CL 104 99 98 99 97*  CO2 24 22 22 24 23   GLUCOSE 111* 146* 140* 120* 137*  BUN 13 20 40* 31* 28*  CREATININE 0.74 0.95 1.06 0.96 0.85  CALCIUM  9.1 9.5 9.3 9.7 9.1   GFR Estimated Creatinine Clearance: 105.2 mL/min (by C-G formula based on SCr of 0.85 mg/dL). Liver Function Tests: Recent Labs  Lab 01/19/25 0405  AST 26  ALT 16  ALKPHOS 108  BILITOT 0.9  PROT 7.1  ALBUMIN 3.5   No results for input(s): LIPASE, AMYLASE in the last 168 hours. No results for input(s): AMMONIA in the last 168 hours. Coagulation profile No results for input(s): INR, PROTIME in the last 168 hours. COVID-19 Labs  No results for input(s): DDIMER, FERRITIN, LDH, CRP in the last 72 hours.  No results found for: SARSCOV2NAA  CBC: Recent Labs  Lab 01/15/25 0909 01/16/25 0424 01/18/25 0404 01/18/25 1202 01/18/25 1403 01/19/25 0405 01/20/25 0424 01/21/25 0242  WBC 11.6* 12.7*   < > 33.1* 33.5* 33.0* 31.9* 28.5*  NEUTROABS 7.7 8.0*  --   --  28.2*  --   --   --   HGB 13.1 12.8*   < > 13.6 13.7 12.0* 12.8* 11.9*  HCT 36.3* 35.6*   < > 37.3* 38.5* 33.5* 35.7* 34.0*  MCV 93.6 94.9   < > 91.6 93.4 93.8 93.9 93.4  PLT 280 282   < > 395 395 379 440* 466*   < > = values in this interval not displayed.   Cardiac Enzymes: No results for input(s): CKTOTAL, CKMB, CKMBINDEX, TROPONINI in the last 168 hours. BNP (last 3 results) No results for input(s): PROBNP in the last 8760 hours. CBG: No results for input(s): GLUCAP in the last 168 hours. D-Dimer: No results for input(s): DDIMER in the last 72 hours. Hgb A1c: No results for input(s): HGBA1C in the last 72 hours. Lipid Profile: No results for input(s): CHOL, HDL, LDLCALC, TRIG, CHOLHDL, LDLDIRECT in the last 72 hours. Thyroid  function studies: No results for input(s): TSH, T4TOTAL, T3FREE, THYROIDAB in the last 72 hours.  Invalid input(s): FREET3 Anemia work up: No results  for input(s): VITAMINB12, FOLATE, FERRITIN, TIBC, IRON, RETICCTPCT in the last 72 hours. Sepsis Labs: Recent Labs  Lab 01/18/25 1202 01/18/25 1403 01/19/25 0405 01/20/25 0424 01/21/25 0242  PROCALCITON 0.88  --   --   --   --   WBC 33.1* 33.5* 33.0* 31.9* 28.5*   Microbiology No results found for this or any previous visit (from the past 240 hours).    Medications:    acetaminophen   1,000 mg Oral QID   apixaban   10 mg Oral BID   Followed by   NOREEN ON 01/24/2025] apixaban   5 mg Oral BID   aspirin   81 mg Oral Daily   atorvastatin   80 mg Oral Daily   azithromycin   500 mg Oral Daily   clopidogrel   75 mg Oral Daily   gabapentin   300 mg Oral TID   lidocaine   1 patch Transdermal Q24H   losartan   25 mg Oral Daily   melatonin  3 mg Oral QHS   methocarbamol   1,000 mg Oral TID   metoprolol  succinate  50 mg Oral QHS   pantoprazole   40 mg Oral Daily   polyethylene glycol  17 g Oral Daily   senna-docusate  1 tablet Oral QHS   sodium chloride  flush  3 mL Intravenous Q12H   Continuous Infusions:      LOS: 11 days   Erle Odell Castor  [facility]  01/21/2025, 9:10 AM

## 2025-01-22 ENCOUNTER — Other Ambulatory Visit (HOSPITAL_COMMUNITY): Payer: Self-pay

## 2025-01-22 DIAGNOSIS — I5021 Acute systolic (congestive) heart failure: Secondary | ICD-10-CM

## 2025-01-22 DIAGNOSIS — I97630 Postprocedural hematoma of a circulatory system organ or structure following a cardiac catheterization: Secondary | ICD-10-CM | POA: Insufficient documentation

## 2025-01-22 DIAGNOSIS — E785 Hyperlipidemia, unspecified: Secondary | ICD-10-CM

## 2025-01-22 LAB — BASIC METABOLIC PANEL WITH GFR
Anion gap: 13 (ref 5–15)
BUN: 23 mg/dL — ABNORMAL HIGH (ref 6–20)
CO2: 25 mmol/L (ref 22–32)
Calcium: 8.9 mg/dL (ref 8.9–10.3)
Chloride: 96 mmol/L — ABNORMAL LOW (ref 98–111)
Creatinine, Ser: 0.77 mg/dL (ref 0.61–1.24)
GFR, Estimated: >60 mL/min
Glucose, Bld: 112 mg/dL — ABNORMAL HIGH (ref 70–99)
Potassium: 3.9 mmol/L (ref 3.5–5.1)
Sodium: 134 mmol/L — ABNORMAL LOW (ref 135–145)

## 2025-01-22 LAB — CBC
HCT: 29.9 % — ABNORMAL LOW (ref 39.0–52.0)
Hemoglobin: 10.6 g/dL — ABNORMAL LOW (ref 13.0–17.0)
MCH: 33.1 pg (ref 26.0–34.0)
MCHC: 35.5 g/dL (ref 30.0–36.0)
MCV: 93.4 fL (ref 80.0–100.0)
Platelets: 475 10*3/uL — ABNORMAL HIGH (ref 150–400)
RBC: 3.2 MIL/uL — ABNORMAL LOW (ref 4.22–5.81)
RDW: 11.7 % (ref 11.5–15.5)
WBC: 25.2 10*3/uL — ABNORMAL HIGH (ref 4.0–10.5)
nRBC: 0 % (ref 0.0–0.2)

## 2025-01-22 NOTE — Progress Notes (Signed)
 Physical Therapy Treatment Patient Details Name: Aaron [name] MRN: [id number] DOB: October 11, 1966 Today's Date: 01/22/2025   History of Present Illness 59 y.o. male admitted 01/10/25 with chest pain; workup for NSTEMI, HF. S/p LHC 1/21 with PCI/DES; complicated by significant R groin/scrotal hematoma. Pt developed hypoxia and tachycardia 1/28; imaging revealed bilateral PE, LLE DVT. PMH includes hemorrhoids, recent tooth infection.   PT Comments  Pt progressing well with mobility. Today's session focused on gait training with rollator, ambulation for improving strength and activity tolerance; pt ambulating at supervision-level, requires encouragement to progress activity, remains limited by c/o groin/scrotum pain and increased nausea. Pt not interested in conversation regarding d/c planning, including potential DME and assist needs; family has been very supportive while pt admitted. HHPT with limited coverage for Medicaid, therefore recommend OPPT if family able to transport pt there. Will continue to follow acutely to address established goals.  HR 100s-130s Post-ambulation BP 120/70     If plan is discharge home, recommend the following: A little help with bathing/dressing/bathroom;Assistance with cooking/housework;Assist for transportation;Help with stairs or ramp for entrance   Can travel by private vehicle     Yes  Equipment Recommendations  Rollator (4 wheels)    Recommendations for Other Services       Precautions / Restrictions Precautions Precautions: Fall;Other (comment) Recall of Precautions/Restrictions: Intact Precaution/Restrictions Comments: watch HR Restrictions Weight Bearing Restrictions Per Provider Order: No     Mobility  Bed Mobility Overal bed mobility: Modified Independent Bed Mobility: Supine to Sit, Sit to Supine                Transfers Overall transfer level: Needs assistance Equipment used: Rollator (4 wheels) Transfers: Sit to/from Stand Sit  to Stand: Supervision           General transfer comment: educ on locking/unlocking rollator brakes;sit<>stand from EOB and rollator seat, supervision for safety/cues to back against wall/object for security    Ambulation/Gait Ambulation/Gait assistance: Supervision Gait Distance (Feet): 150 Feet (+ 180') Assistive device: Rollator (4 wheels) Gait Pattern/deviations: Step-through pattern, Decreased stride length, Wide base of support, Trunk flexed Gait velocity: Decreased     General Gait Details: slow, fatigued gait with rollator and supervision for safety; cues for activity pacing, requires encouragement to increase ambualtion distance; 1x seated rest break secondary to fatigue. pt declines further distance secondary to nausea   Stairs Stairs:  (pt declines stair training)           Wheelchair Mobility     Tilt Bed    Modified Rankin (Stroke Patients Only)       Balance Overall balance assessment: Needs assistance Sitting-balance support: Feet supported, No upper extremity supported Sitting balance-Leahy Scale: Good     Standing balance support: No upper extremity supported, Single extremity supported, Bilateral upper extremity supported Standing balance-Leahy Scale: Fair Standing balance comment: can static stand without UE support                            Communication Communication Communication: No apparent difficulties  Cognition Arousal: Alert Behavior During Therapy: WFL for tasks assessed/performed, Flat affect   PT - Cognitive impairments: No apparent impairments                       PT - Cognition Comments: WFL for simple tasks, not formally assessed. when attempted to engage in convo regarding d/c plan (since SNF declined), pt states, I don't have the capacity  to answer so many questions; pt not forthcoming in conversation, flat affect. later joking appropriately with NT Following commands: Intact      Cueing Cueing  Techniques: Verbal cues  Exercises Other Exercises Other Exercises: [employer] HEP handout provided for generalized BLE strengthening    General Comments General comments (skin integrity, edema, etc.): HR up to 130s with activity, post-ambulation BP 120/70. attempted educ on potential d/c needs, though pt not engaged in conversation; is agreeable to rollator for added stability and energy conservation. pt's mom and aunt present, supportive      Pertinent Vitals/Pain Pain Assessment Pain Assessment: Faces Faces Pain Scale: Hurts little more Pain Location: groin/scortum Pain Descriptors / Indicators: Grimacing, Discomfort, Guarding, Moaning Pain Intervention(s): Monitored during session, Limited activity within patient's tolerance    Home Living                          Prior Function            PT Goals (current goals can now be found in the care plan section) Acute Rehab PT Goals Patient Stated Goal: I don't want to go home until they've got everything straight PT Goal Formulation: With patient Time For Goal Achievement: 02/05/25 Potential to Achieve Goals: Good Progress towards PT goals: Goals updated    Frequency    Min 2X/week      PT Plan      Co-evaluation              AM-PAC PT 6 Clicks Mobility   Outcome Measure  Help needed turning from your back to your side while in a flat bed without using bedrails?: None Help needed moving from lying on your back to sitting on the side of a flat bed without using bedrails?: None Help needed moving to and from a bed to a chair (including a wheelchair)?: A Little Help needed standing up from a chair using your arms (e.g., wheelchair or bedside chair)?: A Little Help needed to walk in hospital room?: A Little Help needed climbing 3-5 steps with a railing? : A Little 6 Click Score: 20    End of Session Equipment Utilized During Treatment: Gait belt Activity Tolerance: Patient tolerated treatment  well Patient left: in bed;with call bell/phone within reach;with bed alarm set;with family/visitor present Nurse Communication: Mobility status PT Visit Diagnosis: Muscle weakness (generalized) (M62.81);Other abnormalities of gait and mobility (R26.89);Pain     Time: 1423-1450 PT Time Calculation (min) (ACUTE ONLY): 27 min  Charges:    $Gait Training: 8-22 mins $Therapeutic Exercise: 8-22 mins PT General Charges $$ ACUTE PT VISIT: 1 Visit                      Darice Almas, PT, DPT Acute Rehabilitation Services  Personal: Secure Chat Rehab Office: [phone]  Darice LITTIE Almas 01/22/2025, 3:35 PM

## 2025-01-22 NOTE — Progress Notes (Signed)
 Occupational Therapy Treatment Patient Details Name: Aaron [name] MRN: [id number] DOB: May 02, 1966 Today's Date: 01/22/2025   History of present illness Pt is a 59 y.o. male who presented 121/26 due to chest pain/STEMI. 1/21 cardiac catheterization. Pt also with R groin/inguinal canal hematoma w/ edema. 1/28 CT chest confirmed B PE likely related to femoral vein thrombosis. PMH: hemorrhoid, recent tooth infection   OT comments  Patient seen in order to increase overall activity tolerance, functional mobility, and assess oxygen. Patient requiring increased time for all tasks due to pain and difficulty taking a deep breath. Patient CGA for bed mobility, and then min A to complete sit<>stands. Patient able to ambulate on RA at 91-94% with HR elevating to 140 BPM. Patient remains limited to short distances due to fatigue and requires moderate cues for upright posture. OT recommendation remains appropriate, will continue to follow acutely.   HR 120 to 140 during session O2 on RA at 91 to 94%      If plan is discharge home, recommend the following:  A lot of help with bathing/dressing/bathroom;Assistance with cooking/housework;Assist for transportation;Help with stairs or ramp for entrance;A little help with walking and/or transfers   Equipment Recommendations  Other (comment) (RW)    Recommendations for Other Services      Precautions / Restrictions Precautions Precautions: Fall;Other (comment) Recall of Precautions/Restrictions: Intact Precaution/Restrictions Comments: watch HR Restrictions Weight Bearing Restrictions Per Provider Order: No       Mobility Bed Mobility Overal bed mobility: Needs Assistance Bed Mobility: Supine to Sit     Supine to sit: Contact guard     General bed mobility comments: CGA with increases time required    Transfers Overall transfer level: Needs assistance Equipment used: Rolling walker (2 wheels) Transfers: Sit to/from Stand Sit to Stand: Min  assist           General transfer comment: Min A to complete sit<>stand, max cues for RW management     Balance Overall balance assessment: Needs assistance Sitting-balance support: Feet supported, No upper extremity supported Sitting balance-Leahy Scale: Fair     Standing balance support: Bilateral upper extremity supported, During functional activity, Reliant on assistive device for balance Standing balance-Leahy Scale: Poor Standing balance comment: reliant on UE support                           ADL either performed or assessed with clinical judgement   ADL Overall ADL's : Needs assistance/impaired     Grooming: Wash/dry hands;Wash/dry face;Set up;Sitting               Lower Body Dressing: Moderate assistance;Sit to/from stand;Sitting/lateral leans   Toilet Transfer: Minimal assistance;Ambulation;Rolling walker (2 wheels)           Functional mobility during ADLs: Minimal assistance;Cueing for sequencing;Cueing for safety;Rolling walker (2 wheels) General ADL Comments: Patient seen in order to increase overall activity tolerance, functional mobility, and assess oxygen. Patient requiring increased time for all tasks due to pain and difficulty taking a deep breath. Patient CGA for bed mobility, and then min A to complete sit<>stands. Patient able to ambulate on RA at 91-94% with HR elevating to 140 BPM. Patient remains limited to short distances due to fatigue and requires moderate cues for upright posture. OT recommendation remains appropriate, will continue to follow acutely.    Extremity/Trunk Assessment Upper Extremity Assessment Upper Extremity Assessment: Generalized weakness            Vision  Perception     Praxis     Communication Communication Communication: No apparent difficulties   Cognition Arousal: Alert Behavior During Therapy: WFL for tasks assessed/performed, Flat affect Cognition: No apparent impairments                                Following commands: Intact        Cueing   Cueing Techniques: Verbal cues  Exercises      Shoulder Instructions       General Comments HR up to 140 with movement, O2 stable on RA 91-94%    Pertinent Vitals/ Pain       Pain Assessment Pain Assessment: Faces Faces Pain Scale: Hurts whole lot Pain Location: L and R side Pain Descriptors / Indicators: Grimacing, Discomfort, Guarding, Tender Pain Intervention(s): Limited activity within patient's tolerance, Monitored during session, Repositioned, RN gave pain meds during session  Home Living                                          Prior Functioning/Environment              Frequency  Min 2X/week        Progress Toward Goals  OT Goals(current goals can now be found in the care plan section)  Progress towards OT goals: Progressing toward goals  Acute Rehab OT Goals Patient Stated Goal: to get better OT Goal Formulation: With patient Time For Goal Achievement: 01/30/25 Potential to Achieve Goals: Fair  Plan      Co-evaluation    PT/OT/SLP Co-Evaluation/Treatment: Yes            AM-PAC OT 6 Clicks Daily Activity     Outcome Measure   Help from another person eating meals?: A Little Help from another person taking care of personal grooming?: A Little Help from another person toileting, which includes using toliet, bedpan, or urinal?: A Lot Help from another person bathing (including washing, rinsing, drying)?: A Lot Help from another person to put on and taking off regular upper body clothing?: A Little Help from another person to put on and taking off regular lower body clothing?: A Lot 6 Click Score: 15    End of Session Equipment Utilized During Treatment: Gait belt;Rolling walker (2 wheels)  OT Visit Diagnosis: Unsteadiness on feet (R26.81);Muscle weakness (generalized) (M62.81);Pain   Activity Tolerance Patient tolerated treatment  well;Patient limited by pain   Patient Left in chair;with call bell/phone within reach   Nurse Communication Mobility status        Time: 8981-8944 OT Time Calculation (min): 37 min  Charges: OT General Charges $OT Visit: 1 Visit OT Treatments $Self Care/Home Management : 23-37 mins  Aaron [name], OTR/L Acute Rehabilitation Services [phone]   Aaron [name] 01/22/2025, 12:19 PM

## 2025-01-22 NOTE — Progress Notes (Signed)
 SATURATION QUALIFICATIONS: (This note is used to comply with regulatory documentation for home oxygen)  Patient Saturations on Room Air at Rest = 96%  Patient Saturations on Room Air while Ambulating = 94%  Patient Saturations on 0 Liters of oxygen while Ambulating = 94%  Please briefly explain why patient needs home oxygen: Patient currently does not need home oxygen, and was able to ambulate on room air at 94%.  Ronal Gift E. Mairely Foxworth, OTR/L Acute Rehabilitation Services [phone]

## 2025-01-22 NOTE — Plan of Care (Signed)

## 2025-01-22 NOTE — Progress Notes (Addendum)
 "  Rounding Note   Patient Name: Aaron [name] Date of Encounter: 01/22/2025  [facility] Cardiologist: Arun K Thukkani, MD   Subjective  Denies any CP, some mid back pain (history of back surgery). Describe right scrotum to be hard, but size seems to be getting smaller.  He does note some right lateral back/chest pain that is worse with certain movements.  Scheduled Meds:  acetaminophen   1,000 mg Oral QID   apixaban   10 mg Oral BID   Followed by   NOREEN ON 01/24/2025] apixaban   5 mg Oral BID   aspirin   81 mg Oral Daily   atorvastatin   80 mg Oral Daily   clopidogrel   75 mg Oral Daily   gabapentin   300 mg Oral TID   lidocaine   1 patch Transdermal Q24H   losartan   25 mg Oral Daily   melatonin  3 mg Oral QHS   methocarbamol   1,000 mg Oral TID   metoprolol  succinate  50 mg Oral QHS   pantoprazole   40 mg Oral Daily   polyethylene glycol  17 g Oral Daily   senna-docusate  1 tablet Oral QHS   sodium chloride  flush  3 mL Intravenous Q12H   Continuous Infusions:  PRN Meds: bisacodyl , [DISCONTINUED]  HYDROmorphone  (DILAUDID ) injection **OR** HYDROmorphone  (DILAUDID ) injection, nitroGLYCERIN , ondansetron  (ZOFRAN ) IV, mouth rinse, oxyCODONE , sodium chloride  flush   Vital Signs  Vitals:   01/21/25 2232 01/22/25 0534 01/22/25 0834 01/22/25 1209  BP: 132/83 114/75 116/81 103/70  Pulse:  (!) 103 (!) 110   Resp:  18 19 20   Temp:  98 F (36.7 C) 98.7 F (37.1 C) 98.1 F (36.7 C)  TempSrc:  Oral Oral Oral  SpO2:  95% 95%   Weight:      Height:        Intake/Output Summary (Last 24 hours) at 01/22/2025 1251 Last data filed at 01/22/2025 9288 Gross per 24 hour  Intake 500 ml  Output 1625 ml  Net -1125 ml      01/11/2025    8:35 PM 01/10/2025    2:00 PM 05/05/2021   10:31 AM  Last 3 Weights  Weight (lbs) 180 lb 180 lb 205 lb  Weight (kg) 81.647 kg 81.647 kg 92.987 kg      Telemetry Sinus tachycardia with HR 100-110s, no significant ventricular ectopy - Personally  Reviewed  ECG  Last EKG 1/28, sinus rhythm with persistent ST elevation in the anterior leads - Personally Reviewed  Physical Exam  GEN: No acute distress.   Neck: No JVD Cardiac: RRR, no murmurs, rubs, or gallops.  Respiratory: Clear to auscultation bilaterally. GI: Soft, nontender, non-distended  MS: No edema; No deformity. Neuro:  Nonfocal  Psych: Normal affect   Labs High Sensitivity Troponin:  No results for input(s): TROPONINIHS in the last 720 hours.   Chemistry Recent Labs  Lab 01/19/25 0405 01/20/25 0424 01/21/25 0242 01/22/25 0234  NA 133* 137 135 134*  K 3.7 4.3 4.1 3.9  CL 98 99 97* 96*  CO2 22 24 23 25   GLUCOSE 140* 120* 137* 112*  BUN 40* 31* 28* 23*  CREATININE 1.06 0.96 0.85 0.77  CALCIUM  9.3 9.7 9.1 8.9  PROT 7.1  --   --   --   ALBUMIN 3.5  --   --   --   AST 26  --   --   --   ALT 16  --   --   --   ALKPHOS 108  --   --   --  BILITOT 0.9  --   --   --   GFRNONAA >60 >60 >60 >60  ANIONGAP 13 13 15 13     Lipids No results for input(s): CHOL, TRIG, HDL, LABVLDL, LDLCALC, CHOLHDL in the last 168 hours.  Hematology Recent Labs  Lab 01/20/25 0424 01/21/25 0242 01/22/25 0234  WBC 31.9* 28.5* 25.2*  RBC 3.80* 3.64* 3.20*  HGB 12.8* 11.9* 10.6*  HCT 35.7* 34.0* 29.9*  MCV 93.9 93.4 93.4  MCH 33.7 32.7 33.1  MCHC 35.9 35.0 35.5  RDW 11.5 11.6 11.7  PLT 440* 466* 475*   Thyroid  No results for input(s): TSH, FREET4 in the last 168 hours.  BNPNo results for input(s): BNP, PROBNP in the last 168 hours.  DDimer No results for input(s): DDIMER in the last 168 hours.   Radiology  No results found.  Cardiac Studies  Cath 01/10/2025: 1.  100% thrombotic occluded mid LAD lesion treated with 2.75 x 24 mm Synergy XD stent postdilated to 3.0 mm.  2.  LVEDP of 26 mmHg with ejection fraction of around 40% with anterior/apical hypokinesis.  3.  Unable to place right radial sheath due to tortuous radial artery necessitating right  femoral approach.  4.  POC lactate decreased from 2.16 to 1.59. Summary: Femoral sheath will be removed when ACT is appropriate.  Continue dual antiplatelet therapy with aspirin  and ticagrelor  for 1 year.  Admitted to heart unit.  Monitor on telemetry.  Obtain echocardiogram.  I left a message with the patient's aunt Gaynelle regarding the results of the procedure.    Echo 01/11/2025: LVEF 45 to 50%.  Mildly decreased function with mid-distal anterior septal inferoseptal apical anterior and apical inferior/apical hypokinesis.  Normal diastolic patterns.  Normal RV.  Normal aortic and mitral valves.  Normal RA.   Echocardiogram 01/18/2025: EF remains 45 to 50%.  Apical septal anterior and mid-apical anterolateral akinesis, apical akinesis.  Otherwise normal RV, normal valves.  Normal pressures.   Patient Profile   59 y.o. male with PMH of recent tooth infection and hemorrhoid presented with anterolateral STEMI. S/p mid LAD stenting.   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 LVEDP 26 mmHg. Turtuous radial artery necessitating R femoral approach.  => Unfortunately, femoral approach complicated by significant right groin hematoma/scrotal hematoma  - Initial DAPT of ASA/Brilinta  converted to ASA/Plavix  with addition of Eliquis  after diagnosis of PE.,   - Triple therapy ASA, plavix  and Eliquis , plan to drop ASA at the time of discharge.   - metoprolol  succinate 50 mg daily and losartan  25 mg daily => if BP tolerates, would try to  titrate beta-blocker slightly further due to tachycardia  - High-dose atorvastatin   - hopefully PT can see again, if hemoglobin stabilize tomorrow, may consider discharge.   Bilateral PE - developed hypoxia and tachycardia. CTA 01/17/2025 showed bilateral PE, no evidence of right heart strain - venous doppler showed LLE DVT (interesting that he has a left leg DVT in the setting of right leg hematoma) - Acute VTE dosing eliquis , Eliquis  10mg  BID for  7 days, then 5mg  BID thereafter (DAPT converted as noted to ASA/Plavix  with plans to stop ASA on discharge)  Acute HFmrEF  - Echo 01/11/2025 showed EF 45-50% with wall LAD distribution wall motion abnormality, confirmed with follow-up limited echo in the setting of PE  - Euvolemic on exam with no significant heart failure symptoms.  Is on Toprol  and losartan  (blood pressures would not tolerate Entresto).  Could consider GLP-1 agonist in the outpatient setting once stabilized, but will hold off on spironolactone due to low blood pressures and EF over 45%).  NSVT: no significant ventricular ectopy on tele  HLD: Started on high-dose atorvastatin  80 mg daily  Right groin hematoma/scrotal hematoma  Notes significant amount of pain mostly in the scrotal area with hardening of the right side of the scrotum. => Expectant management  - CT abdomen pelvis showed no retroperitoneal hemorrhage, 6.5 x 4.7 x 11 cm right groin/inguinal canal hematoma with edema   - Repeat CT 1/28 with R inguinal canal hematoma similar slightly decreased since prior. No RP bleed.   - hemoglobin trending down 13.7 --> 12.0 --> 12.8 --> 11.9 --> 10.6; will need reassessment of hemoglobin levels in the outpatient setting which will also to follow low WBCs.  Leukocytosis: completed empiric abx. CT of chest with bilateral lower lobe patchy changes. WBC remain elevated at 25K  Right renal nodule: noted on CT, follow with PCP   Medical Readiness Date: 01/18/2025 untenable due to PE and  hematoma.  Now medical readiness for discharge is depending upon PT/OT assessment to determine DC to Skilled Nursing Facility Rehab versus home health with PT.  Anticipate discharge in 1 to 2 days depending upon PT/OT assessment.   For questions or updates, please contact [facility] Please consult [email or web address] for contact info under     Signed: Scot Ford, PA  ATTENDING ATTESTATION  I have seen, examined and evaluated the patient this morning on rounds along with Hao Meng, PA.  After reviewing all the available data and chart, we discussed the patients laboratory, study & physical findings as well as symptoms in detail.  I agree with his findings, examination as well as impression recommendations as per our discussion.    Attending adjustments noted in italics.   Complicated story with patient came in with a STEMI-LAD PCI with mildly reduced EF with significant LAD wash abnormality and mild HFmrEF.  Unfortunately he developed right groin hematoma that drained into the scrotum leading to scrotum pain this likely occurred in the setting of initiation of anticoagulation for new onset bilateral PE. DAPT converted from ASA/Brilinta  to ASA/Plavix  plus Eliquis  with plans to stop aspirin  on discharge On high-dose statin as well as Toprol  and losartan .  No diuretic requirement.  May need to titrate beta-blocker further due to tachycardia Follow-up blood counts with hematoma on anticoagulation.  Pending PT OT evaluation to determine disposition plan.    [name], MD, MS Alm Clay, M.D., M.S. Interventional Cardiologist  [facility] Pager # [phone]     Signed, Alm Clay, MD  01/22/2025, 12:51 PM    "

## 2025-01-22 NOTE — TOC Progression Note (Signed)
 Transition of Care [facility]) - Progression Note    Patient Details  Name: Aaron [name] MRN: [id number] Date of Birth: November 13, 1966  Transition of Care [facility]) CM/SW Contact  Graves-Bigelow, Erminio Deems, RN Phone Number: 01/22/2025, 3:05 PM  Clinical Narrative: PT/OT has worked with patient and both disciplines are agreeable to outpatient PT if the patient has transportation to and from the office. ICM did call goddaughter Coree to discuss the plan of care; however, received voicemail. Awaiting call back to discuss further disposition plan.   Expected Discharge Plan: OP Rehab Barriers to Discharge: No Barriers Identified  Expected Discharge Plan and Services In-house Referral: Clinical Social Work Discharge Planning Services: CM Consult Post Acute Care Choice: NA Living arrangements for the past 2 months: Single Family Home                   DME Agency: NA                   Social Drivers of Health (SDOH) Interventions SDOH Screenings   Food Insecurity: No Food Insecurity (01/11/2025)  Housing: Low Risk (01/11/2025)  Transportation Needs: No Transportation Needs (01/11/2025)  Utilities: Not At Risk (01/11/2025)  Tobacco Use: High Risk (01/11/2025)    Readmission Risk Interventions     No data to display

## 2025-01-22 NOTE — Plan of Care (Signed)

## 2025-01-23 ENCOUNTER — Telehealth (HOSPITAL_COMMUNITY): Payer: Self-pay

## 2025-01-23 ENCOUNTER — Other Ambulatory Visit (HOSPITAL_COMMUNITY): Payer: Self-pay

## 2025-01-23 LAB — BASIC METABOLIC PANEL WITH GFR
Anion gap: 11 (ref 5–15)
BUN: 16 mg/dL (ref 6–20)
CO2: 26 mmol/L (ref 22–32)
Calcium: 9 mg/dL (ref 8.9–10.3)
Chloride: 99 mmol/L (ref 98–111)
Creatinine, Ser: 0.8 mg/dL (ref 0.61–1.24)
GFR, Estimated: >60 mL/min
Glucose, Bld: 128 mg/dL — ABNORMAL HIGH (ref 70–99)
Potassium: 3.5 mmol/L (ref 3.5–5.1)
Sodium: 135 mmol/L (ref 135–145)

## 2025-01-23 LAB — CBC
HCT: 30 % — ABNORMAL LOW (ref 39.0–52.0)
Hemoglobin: 10.7 g/dL — ABNORMAL LOW (ref 13.0–17.0)
MCH: 33.2 pg (ref 26.0–34.0)
MCHC: 35.7 g/dL (ref 30.0–36.0)
MCV: 93.2 fL (ref 80.0–100.0)
Platelets: 520 10*3/uL — ABNORMAL HIGH (ref 150–400)
RBC: 3.22 MIL/uL — ABNORMAL LOW (ref 4.22–5.81)
RDW: 11.9 % (ref 11.5–15.5)
WBC: 24.7 10*3/uL — ABNORMAL HIGH (ref 4.0–10.5)
nRBC: 0 % (ref 0.0–0.2)

## 2025-01-23 MED ORDER — OXYCODONE HCL 5 MG PO TABS
2.5000 mg | ORAL_TABLET | ORAL | Status: DC | PRN
  Administered 2025-01-23: 5 mg via ORAL
  Filled 2025-01-23: qty 1

## 2025-01-23 MED ORDER — ASPIRIN 81 MG PO TBEC
81.0000 mg | DELAYED_RELEASE_TABLET | Freq: Every day | ORAL | Status: DC
  Administered 2025-01-24 – 2025-01-25 (×2): 81 mg via ORAL
  Filled 2025-01-23 (×2): qty 1

## 2025-01-23 MED ORDER — ACETAMINOPHEN 500 MG PO TABS
500.0000 mg | ORAL_TABLET | Freq: Four times a day (QID) | ORAL | Status: DC
  Administered 2025-01-23 – 2025-01-25 (×7): 500 mg via ORAL
  Filled 2025-01-23 (×7): qty 1

## 2025-01-23 MED ORDER — EMPAGLIFLOZIN 10 MG PO TABS
10.0000 mg | ORAL_TABLET | Freq: Every day | ORAL | Status: DC
  Administered 2025-01-23 – 2025-01-25 (×3): 10 mg via ORAL
  Filled 2025-01-23 (×3): qty 1

## 2025-01-23 MED ORDER — METHOCARBAMOL 500 MG PO TABS
500.0000 mg | ORAL_TABLET | Freq: Three times a day (TID) | ORAL | Status: DC
  Administered 2025-01-23 – 2025-01-25 (×7): 500 mg via ORAL
  Filled 2025-01-23 (×7): qty 1

## 2025-01-23 MED ORDER — OXYCODONE HCL 5 MG PO TABS
10.0000 mg | ORAL_TABLET | ORAL | Status: DC | PRN
  Administered 2025-01-23 – 2025-01-24 (×4): 10 mg via ORAL
  Filled 2025-01-23 (×4): qty 2

## 2025-01-23 NOTE — Plan of Care (Signed)
" °  Problem: Education: Goal: Knowledge of General Education information will improve Description: Including pain rating scale, medication(s)/side effects and non-pharmacologic comfort measures Outcome: Progressing   Problem: Health Behavior/Discharge Planning: Goal: Ability to manage health-related needs will improve Outcome: Progressing   Problem: Clinical Measurements: Goal: Ability to maintain clinical measurements within normal limits will improve Outcome: Progressing   Problem: Clinical Measurements: Goal: Will remain free from infection Outcome: Progressing   Problem: Clinical Measurements: Goal: Cardiovascular complication will be avoided Outcome: Progressing   Problem: Activity: Goal: Risk for activity intolerance will decrease Outcome: Progressing   Problem: Pain Managment: Goal: General experience of comfort will improve and/or be controlled Outcome: Progressing   Problem: Skin Integrity: Goal: Risk for impaired skin integrity will decrease Outcome: Progressing   Problem: Safety: Goal: Ability to remain free from injury will improve Outcome: Progressing   Problem: Activity: Goal: Ability to return to baseline activity level will improve Outcome: Progressing   Problem: Clinical Measurements: Goal: Respiratory complications will improve Outcome: Progressing   "

## 2025-01-23 NOTE — Plan of Care (Signed)
" °  Problem: Pain Managment: Goal: General experience of comfort will improve and/or be controlled Outcome: Not Progressing Note: Pt was upset when RN asked him about his pain goal. He said he doesn't want to hear about pain & d/c he wants to be healed. Pt advised he will be comfortable prior to d/c but he may not be completely pain free. Pt's mom stated pain is what the pt says it is. This information has been updated to the pt's provider. Will continue to monitor the pt.    "

## 2025-01-23 NOTE — Telephone Encounter (Signed)
 Pharmacy Patient Advocate Encounter  Received notification from [employer] that Prior Authorization for Jardiance  10MG  tablets has been APPROVED from 01/23/25 to 01/23/26. Ran test claim, Copay is $4. This test claim was processed through [facility]- copay amounts may vary at other pharmacies due to pharmacy/plan contracts, or as the patient moves through the different stages of their insurance plan.   PA #/Case ID/Reference #: [id number]

## 2025-01-23 NOTE — Progress Notes (Addendum)
 Physical Therapy Treatment Patient Details Name: Aaron [name] MRN: [id number] DOB: 22-Jul-1966 Today's Date: 01/23/2025   History of Present Illness 59 y.o. male admitted 01/10/25 with chest pain; workup for NSTEMI, HF. S/p LHC 1/21 with PCI/DES; complicated by significant R groin/scrotal hematoma. Pt developed hypoxia and tachycardia 1/28; imaging revealed bilateral PE, LLE DVT. PMH includes hemorrhoids, recent tooth infection.   PT Comments  Pt progressing well with mobility, demonstrating improved tolerance for longer ambulation distances (260' + 260') with rollator at supervision-level; also performing majority of seated/standing self-care tasks without assist. Pt remains limited by c/o fatigue and scrotal pain with mobility, difficulty taking deep breath. Encouraged more frequent OOB mobility with nursing and mobility specialists. Will continue to follow acutely.      If plan is discharge home, recommend the following: A little help with bathing/dressing/bathroom;Assistance with cooking/housework;Assist for transportation;Help with stairs or ramp for entrance   Can travel by private vehicle     Yes  Equipment Recommendations  Rollator (4 wheels)    Recommendations for Other Services       Precautions / Restrictions Precautions Precautions: Fall Recall of Precautions/Restrictions: Intact Restrictions Weight Bearing Restrictions Per Provider Order: No     Mobility  Bed Mobility Overal bed mobility: Modified Independent Bed Mobility: Supine to Sit                Transfers Overall transfer level: Modified independent Equipment used: Rollator (4 wheels) Transfers: Sit to/from Stand Sit to Stand: Modified independent (Device/Increase time)           General transfer comment: multiple sit<>stands from EOB, rollator seat, toilet and recliner, mod indep with rollator; good awareness of locking/unlocking brakes without cues    Ambulation/Gait Ambulation/Gait  assistance: Supervision Gait Distance (Feet): 260 Feet (+ 260') Assistive device: Rollator (4 wheels) Gait Pattern/deviations: Step-through pattern, Decreased stride length, Wide base of support, Trunk flexed Gait velocity: Decreased     General Gait Details: slow, antalgic gait with rollator and supervision for safety; pt self-monitoring activity pacing and need for seated rest break, appropriately pushing rollator against wall before sitting, endorses due to pain and fatigue; pt declines gait trial without DME. pt taking pivotal steps from bed>recliner without DME, guarded, supervision for safety   Stairs Stairs:  (pt declines stair training)           Wheelchair Mobility     Tilt Bed    Modified Rankin (Stroke Patients Only)       Balance Overall balance assessment: Needs assistance Sitting-balance support: Feet supported, No upper extremity supported Sitting balance-Leahy Scale: Good Sitting balance - Comments: indep pericare/toileting   Standing balance support: No upper extremity supported, Single extremity supported, Bilateral upper extremity supported Standing balance-Leahy Scale: Fair Standing balance comment: can stand and take steps without UE support, washing hand and performing posterior hygiene at sink with and without single UE support                            Communication Communication Communication: No apparent difficulties  Cognition Arousal: Alert Behavior During Therapy: WFL for tasks assessed/performed   PT - Cognitive impairments: No apparent impairments                       PT - Cognition Comments: WFL for simple tasks; more engaged in conversation today, reports he just does not like a lot of talking or questions Following commands: Intact  Cueing    Exercises Other Exercises Other Exercises: ambulation for improving strength and activity tolerance    General Comments General comments (skin integrity, edema,  etc.): HR up to 130s with activity; SpO2 99% on RA with ambulation; post-ambulation BP 119/87. pt's mom present and supportive. reviewed education; pt reports no pain with LE HEP      Pertinent Vitals/Pain Pain Assessment Pain Assessment: Faces Faces Pain Scale: Hurts little more Pain Location: groin/scortum Pain Descriptors / Indicators: Discomfort, Guarding, Moaning Pain Intervention(s): Monitored during session    Home Living                          Prior Function            PT Goals (current goals can now be found in the care plan section) Progress towards PT goals: Progressing toward goals    Frequency    Min 2X/week      PT Plan      Co-evaluation              AM-PAC PT 6 Clicks Mobility   Outcome Measure  Help needed turning from your back to your side while in a flat bed without using bedrails?: None Help needed moving from lying on your back to sitting on the side of a flat bed without using bedrails?: None Help needed moving to and from a bed to a chair (including a wheelchair)?: A Little Help needed standing up from a chair using your arms (e.g., wheelchair or bedside chair)?: None Help needed to walk in hospital room?: A Little Help needed climbing 3-5 steps with a railing? : A Little 6 Click Score: 21    End of Session   Activity Tolerance: Patient tolerated treatment well Patient left: in chair;with call bell/phone within reach;with chair alarm set;with family/visitor present Nurse Communication: Mobility status PT Visit Diagnosis: Muscle weakness (generalized) (M62.81);Other abnormalities of gait and mobility (R26.89);Pain     Time: 8995-8961 PT Time Calculation (min) (ACUTE ONLY): 34 min  Charges:    $Therapeutic Exercise: 8-22 mins $Therapeutic Activity: 8-22 mins PT General Charges $$ ACUTE PT VISIT: 1 Visit                      Darice Almas, PT, DPT Acute Rehabilitation Services  Personal: [facility]  Office: [phone]  Darice LITTIE Almas 01/23/2025, 2:20 PM

## 2025-01-24 DIAGNOSIS — Z955 Presence of coronary angioplasty implant and graft: Secondary | ICD-10-CM

## 2025-01-24 LAB — BASIC METABOLIC PANEL WITH GFR
Anion gap: 9 (ref 5–15)
BUN: 13 mg/dL (ref 6–20)
CO2: 25 mmol/L (ref 22–32)
Calcium: 9.1 mg/dL (ref 8.9–10.3)
Chloride: 102 mmol/L (ref 98–111)
Creatinine, Ser: 0.75 mg/dL (ref 0.61–1.24)
GFR, Estimated: >60 mL/min
Glucose, Bld: 116 mg/dL — ABNORMAL HIGH (ref 70–99)
Potassium: 3.9 mmol/L (ref 3.5–5.1)
Sodium: 136 mmol/L (ref 135–145)

## 2025-01-24 LAB — CBC
HCT: 29.6 % — ABNORMAL LOW (ref 39.0–52.0)
Hemoglobin: 10.4 g/dL — ABNORMAL LOW (ref 13.0–17.0)
MCH: 32.8 pg (ref 26.0–34.0)
MCHC: 35.1 g/dL (ref 30.0–36.0)
MCV: 93.4 fL (ref 80.0–100.0)
Platelets: 576 10*3/uL — ABNORMAL HIGH (ref 150–400)
RBC: 3.17 MIL/uL — ABNORMAL LOW (ref 4.22–5.81)
RDW: 12.1 % (ref 11.5–15.5)
WBC: 21.1 10*3/uL — ABNORMAL HIGH (ref 4.0–10.5)
nRBC: 0 % (ref 0.0–0.2)

## 2025-01-24 MED ORDER — OXYCODONE HCL 5 MG PO TABS
2.5000 mg | ORAL_TABLET | ORAL | Status: DC | PRN
  Administered 2025-01-24 – 2025-01-25 (×4): 5 mg via ORAL
  Filled 2025-01-24 (×4): qty 1

## 2025-01-24 NOTE — Progress Notes (Addendum)
 " PROGRESS NOTE    Finneus Kaneshiro  FMW:[id number] DOB: Jul 01, 1966 DOA: 01/10/2025 PCP: Patient, No Pcp Per   No chief complaint on file.   Brief Narrative:   Aaron [name] is a 59 year old male with family history significant for heart disease. Patient presented to the ED on 1/21 after experiencing left-sided chest pain with radiation down the left arm at 13:00. Initial ECG at 13:33 showed sinus rhythm with J point elevation. Repeat ECG @14 :04 showed ST elevation in anterolateral leads. Patient received aspirin  en route and was immediately brought to cardiac cath lab. A LAD artery stent was placed.    Assessment & Plan: Anterolateral STEMI  Family history of heart disease. Chronic smoker. BMI 25.  Latest troponin level of 949 on 1/29. 1/22 >> Cardiac cath shows 100% LAD stenosis and minimal RCA luminal irregularities. 0% residual LAD stenosis post intervention. 1/22 >> CT abdomen/pelvis without contrast shows aortic atherosclerosis.  Stable.  Continue DAPT, atorvastatin , metoprolol , and losartan .   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  1/22 >> Echocardiogram shows left ventricular EF of 45-50% with mildly decreased LV function. No left ventricular hypertrophy.  Stable.  Continue SGLT2, metoprolol , losartan .   Pulmonary Emboli  Unprovoked.  1/28 >> CT abdomen/pelvis with contrast shows bilateral pulmonary artery emboli with no evidence of right heart strain.  1/29 >> Venous Doppler US  shows acute DVT involving the left popliteal vein. Continue apixaban . Thrombectomy not recommended.   Neutrophilia Leukocytosis Suspected community-acquired pneumonia.  WBC 21.1. Neutrophils 8.8 (1/21). Procalcitonin 0.88 (1/29).  1/28 >> CT abdomen/pelvis with contrast shows bilateral lower lobe patchy consolidative changes that may represent atelectasis or pneumonia.  Improving.  Finished course of antibiotics. Repeat CT abdomen/pelvis.   Hematoma of Groin Secondary to cardiac cath trauma.   1/22 >> CT abdomen/pelvis without contrast shows a 6.5 x 4.7 x 11.0 cm right groin/inguinal canal hematoma with extensive edema in the right groin region and scrotum.  Hgb stable at 10.4 Monitor.   Hypokalemia, Hypocalcemia Potassium 3.9. Calcium  9.1 Resolved. Monitor.   Hyperlipidemia Continue statin therapy.   Diverticulosis 1/22 >> CT abdomen/pelvis without contrast shows left colonic diverticulosis without diverticulitis.  Outpatient follow-up.   Hernia  1/22 >> CT abdomen/pelvis without contrast shows left groin hernia.  Outpatient follow-up.   Right Renal Nodule 1/22 >> CT abdomen/pelvis shows right renal nodule, most likely benign.  Outpatient follow-up     DVT prophylaxis: apixaban   Code Status: Full  Family Communication: mother at bedside.  Disposition:   Status is: Inpatient Remains inpatient appropriate because: severity of illness    Consultants:  Cardiology   Procedures:   Antimicrobials:    Subjective:  Patient appears upset and tired. Sitting in chair. Not engaging in conversation; states that talking causes shortness of breath. Does not report new symptoms.   Objective: Vitals:   01/23/25 2118 01/23/25 2341 01/24/25 0450 01/24/25 0824  BP: 119/79 107/67 111/67 111/63  Pulse: (!) 108 (!) 102 (!) 104 (!) 102  Resp:  16 18 20   Temp:  98.3 F (36.8 C) (!) 97.5 F (36.4 C) 98.1 F (36.7 C)  TempSrc:  Oral Oral Oral  SpO2:    100%  Weight:      Height:        Intake/Output Summary (Last 24 hours) at 01/24/2025 0852 Last data filed at 01/24/2025 0450 Gross per 24 hour  Intake 880 ml  Output 2475 ml  Net -1595 ml   Filed Weights   01/10/25 1400 01/11/25 2035 01/23/25  0423  Weight: 81.6 kg 81.6 kg 84.6 kg    Examination: General exam: Appears upset.  Respiratory system: Respiratory effort normal. Clear to auscultation.  Cardiovascular system: S1 & S2 heard, RRR. No JVD, murmurs, rubs, gallops or clicks. No pedal  edema. Gastrointestinal system: Abdomen is nondistended, soft and nontender. No organomegaly or masses felt. Normal bowel sounds heard. Central nervous system: Alert and oriented. No focal neurological deficits. Extremities: Symmetric 5 x 5 power. Skin: No rashes, lesions or ulcers. Right groin/scrotal hematoma.  Psychiatry: Judgement and insight appear normal. Mood & affect appropriate.    Data Reviewed: I have personally reviewed following labs and imaging studies.  CBC: Recent Labs  Lab 01/18/25 1403 01/19/25 0405 01/20/25 0424 01/21/25 0242 01/22/25 0234 01/23/25 0513 01/24/25 0553  WBC 33.5*   < > 31.9* 28.5* 25.2* 24.7* 21.1*  NEUTROABS 28.2*  --   --   --   --   --   --   HGB 13.7   < > 12.8* 11.9* 10.6* 10.7* 10.4*  HCT 38.5*   < > 35.7* 34.0* 29.9* 30.0* 29.6*  MCV 93.4   < > 93.9 93.4 93.4 93.2 93.4  PLT 395   < > 440* 466* 475* 520* 576*   < > = values in this interval not displayed.    Basic Metabolic Panel: Recent Labs  Lab 01/20/25 0424 01/21/25 0242 01/22/25 0234 01/23/25 0513 01/24/25 0553  NA 137 135 134* 135 136  K 4.3 4.1 3.9 3.5 3.9  CL 99 97* 96* 99 102  CO2 24 23 25 26 25   GLUCOSE 120* 137* 112* 128* 116*  BUN 31* 28* 23* 16 13  CREATININE 0.96 0.85 0.77 0.80 0.75  CALCIUM  9.7 9.1 8.9 9.0 9.1    GFR: Estimated Creatinine Clearance: 111.8 mL/min (by C-G formula based on SCr of 0.75 mg/dL).  Liver Function Tests: Recent Labs  Lab 01/19/25 0405  AST 26  ALT 16  ALKPHOS 108  BILITOT 0.9  PROT 7.1  ALBUMIN 3.5    CBG: No results for input(s): GLUCAP in the last 168 hours.   No results found for this or any previous visit (from the past 240 hours).     Radiology Studies: No results found.    Scheduled Meds:  acetaminophen   500 mg Oral QID   apixaban   10 mg Oral BID   Followed by   apixaban   5 mg Oral BID   aspirin  EC  81 mg Oral Daily   atorvastatin   80 mg Oral Daily   clopidogrel   75 mg Oral Daily   empagliflozin    10 mg Oral Daily   gabapentin   300 mg Oral TID   lidocaine   1 patch Transdermal Q24H   losartan   25 mg Oral Daily   melatonin  3 mg Oral QHS   methocarbamol   500 mg Oral TID   metoprolol  succinate  50 mg Oral QHS   pantoprazole   40 mg Oral Daily   polyethylene glycol  17 g Oral Daily   senna-docusate  1 tablet Oral QHS   sodium chloride  flush  3 mL Intravenous Q12H   Continuous Infusions:   LOS: 14 days     Orland Born, Student PA [facility]   To contact the attending provider between 7A-7P or the covering provider during after hours 7P-7A, please log into the web site [email or web address] and access using universal [place] password for that web site. If you do not have the password, please call the hospital  operator.  01/24/2025, 8:52 AM   "

## 2025-01-24 NOTE — Plan of Care (Signed)
" °  Problem: Education: Goal: Knowledge of General Education information will improve Description: Including pain rating scale, medication(s)/side effects and non-pharmacologic comfort measures Outcome: Progressing   Problem: Health Behavior/Discharge Planning: Goal: Ability to manage health-related needs will improve Outcome: Progressing   Problem: Clinical Measurements: Goal: Ability to maintain clinical measurements within normal limits will improve Outcome: Progressing   Problem: Clinical Measurements: Goal: Will remain free from infection Outcome: Progressing   Problem: Clinical Measurements: Goal: Diagnostic test results will improve Outcome: Progressing   Problem: Clinical Measurements: Goal: Respiratory complications will improve Outcome: Progressing   Problem: Activity: Goal: Risk for activity intolerance will decrease Outcome: Progressing   Problem: Pain Managment: Goal: General experience of comfort will improve and/or be controlled Outcome: Progressing   Problem: Safety: Goal: Ability to remain free from injury will improve Outcome: Progressing   Problem: Skin Integrity: Goal: Risk for impaired skin integrity will decrease Outcome: Progressing   Problem: Health Behavior/Discharge Planning: Goal: Ability to safely manage health-related needs after discharge will improve Outcome: Progressing   Problem: Cardiovascular: Goal: Vascular access site(s) Level 0-1 will be maintained Outcome: Progressing   "

## 2025-01-24 NOTE — Progress Notes (Signed)
 CARDIAC REHAB PHASE I   PRE:  Rate/Rhythm: 100 ST    BP: sitting 106/72    SpO2: 98 RA  MODE:  Ambulation: 300 ft   POST:  Rate/Rhythm: 127 ST    BP: sitting 112/72     SpO2: 100 RA   Pt moved himself to EOB and then stood pulling up on RW. Ambulated with RW and standby assist, slow and steady, Fatigue with distance, HR up to 127 ST. Requested return to bed.   Reviewed/encouraged walking at home and CRPII. Pt denied questions. Discussed importance of not staying in bed at home.  [phone]  Aliene Aris BS, ACSM-CEP 01/24/2025 1:12 PM

## 2025-01-24 NOTE — Progress Notes (Signed)
 Occupational Therapy Treatment Patient Details Name: Aaron [name] MRN: [id number] DOB: [birth year] Today's Date: 01/24/2025   History of present illness 59 y.o. male admitted 01/10/25 with chest pain; workup for NSTEMI, HF. S/p LHC 1/21 with PCI/DES; complicated by significant R groin/scrotal hematoma. Pt developed hypoxia and tachycardia 1/28; imaging revealed bilateral PE, LLE DVT. PMH includes hemorrhoids, recent tooth infection.   OT comments  Pt presented in bed with mother present at the start of session. Within increase in time completed bed mobility with mod I, sit to stand transfers with supervision and then wanted to ambulate to sink with RW to complete ADLS in standing position. Pt was set up then completed UE bathing/dressing and peri care with CGA. He then started to fatigue and required to sit with chair placed under pt. Pt then did not voice how he was feeling but signaled for a drink and emesis bag. Pt then agreed to remain to stay in the chair with alarm in  placed.  Per mother reported they can assist at dc and now recommendation for OP Therapy with the return to home.       If plan is discharge home, recommend the following:  A lot of help with bathing/dressing/bathroom;Assistance with cooking/housework;Assist for transportation;Help with stairs or ramp for entrance;A little help with walking and/or transfers   Equipment Recommendations   (4ww)    Recommendations for Other Services      Precautions / Restrictions Precautions Precautions: Fall Recall of Precautions/Restrictions: Intact Precaution/Restrictions Comments: watch HR Restrictions Weight Bearing Restrictions Per Provider Order: No       Mobility Bed Mobility Overal bed mobility: Needs Assistance Bed Mobility: Supine to Sit     Supine to sit: Supervision, HOB elevated, Used rails          Transfers Overall transfer level: Modified independent Equipment used: Rolling walker (2 wheels) Transfers:  Sit to/from Stand Sit to Stand: Supervision           General transfer comment: needs increase in time     Balance Overall balance assessment: Needs assistance Sitting-balance support: Feet supported Sitting balance-Leahy Scale: Good     Standing balance support: Single extremity supported, Bilateral upper extremity supported, No upper extremity supported Standing balance-Leahy Scale: Fair Standing balance comment: as pt started to fatigue while standing at sink for ADLS required increase in BUE support                           ADL either performed or assessed with clinical judgement   ADL Overall ADL's : Needs assistance/impaired Eating/Feeding: Independent;Sitting   Grooming: Wash/dry face;Wash/dry hands;Oral care;Contact guard assist;Standing   Upper Body Bathing: Set up;Standing   Lower Body Bathing: Contact guard assist;Sit to/from stand   Upper Body Dressing : Contact guard assist;Standing           Toileting- Clothing Manipulation and Hygiene: Contact guard assist;Sit to/from stand       Functional mobility during ADLs: Contact guard assist;Rolling walker (2 wheels);Cueing for sequencing;Cueing for safety      Extremity/Trunk Assessment Upper Extremity Assessment Upper Extremity Assessment: Generalized weakness            Vision       Perception     Praxis     Communication Communication Communication: No apparent difficulties   Cognition Arousal: Alert Behavior During Therapy: WFL for tasks assessed/performed Cognition: No apparent impairments  OT - Cognition Comments: However, at times doe not like to speak to therapist. Per mother reproted they just are attempting to get their breath why they go quiet                 Following commands: Intact        Cueing   Cueing Techniques: Verbal cues  Exercises      Shoulder Instructions       General Comments HR up to 131 with standing ADLS. Pt's  mother was in the room at the start of session and reported they are going to provide support with the return to home.    Pertinent Vitals/ Pain       Pain Assessment Pain Assessment: Faces Faces Pain Scale: Hurts little more Pain Location: groin/scortum Pain Descriptors / Indicators: Discomfort, Guarding, Moaning Pain Intervention(s): Limited activity within patient's tolerance, Monitored during session  Home Living                                          Prior Functioning/Environment              Frequency  Min 2X/week        Progress Toward Goals  OT Goals(current goals can now be found in the care plan section)  Progress towards OT goals: Progressing toward goals  Acute Rehab OT Goals Patient Stated Goal: none OT Goal Formulation: With patient Time For Goal Achievement: 01/30/25 Potential to Achieve Goals: Fair  Plan      Co-evaluation                 AM-PAC OT 6 Clicks Daily Activity     Outcome Measure   Help from another person eating meals?: None Help from another person taking care of personal grooming?: None Help from another person toileting, which includes using toliet, bedpan, or urinal?: A Little Help from another person bathing (including washing, rinsing, drying)?: A Little Help from another person to put on and taking off regular upper body clothing?: None Help from another person to put on and taking off regular lower body clothing?: A Little 6 Click Score: 21    End of Session Equipment Utilized During Treatment: Gait belt;Rolling walker (2 wheels)  OT Visit Diagnosis: Unsteadiness on feet (R26.81);Muscle weakness (generalized) (M62.81);Pain Pain - part of body:  (groin)   Activity Tolerance Other (comment) (at the end session pt asking for drin and emesis bag but would not report how they felt, nursing aware.)   Patient Left in chair;with call bell/phone within reach;with chair alarm set;with family/visitor  present (mother)   Nurse Communication Mobility status (how pt appeared to be feeling sick at the end of session)        Time: 9049-8980 OT Time Calculation (min): 29 min  Charges: OT General Charges $OT Visit: 1 Visit OT Treatments $Self Care/Home Management : 23-37 mins  Warrick POUR OTR/L  Acute Rehab Services  [phone] office number   Warrick Berber 01/24/2025, 10:36 AM

## 2025-01-24 NOTE — Progress Notes (Addendum)
 Right SI joint Progress Note  Patient Name: Aaron [name] Date of Encounter: 01/24/2025 [facility] Cardiologist: Arun K Thukkani, MD   Interval Summary    Still with ongoing groin pain, but actually sitting up in the chair.   Vital Signs Vitals:   01/23/25 2118 01/23/25 2341 01/24/25 0450 01/24/25 0824  BP: 119/79 107/67 111/67 111/63  Pulse: (!) 108 (!) 102 (!) 104 (!) 102  Resp:  16 18 20   Temp:  98.3 F (36.8 C) (!) 97.5 F (36.4 C) 98.1 F (36.7 C)  TempSrc:  Oral Oral Oral  SpO2:    100%  Weight:      Height:        Intake/Output Summary (Last 24 hours) at 01/24/2025 1031 Last data filed at 01/24/2025 0450 Gross per 24 hour  Intake 880 ml  Output 2475 ml  Net -1595 ml      01/23/2025    4:23 AM 01/11/2025    8:35 PM 01/10/2025    2:00 PM  Last 3 Weights  Weight (lbs) 186 lb 9.6 oz 180 lb 180 lb  Weight (kg) 84.641 kg 81.647 kg 81.647 kg      Telemetry/ECG  Sinus rhythm with intermittent tachycardia - Personally Reviewed  Physical Exam  GEN: No acute distress.  = He actually looks a lot more comfortable today than yesterday.  Had just gotten back from a walk.  Feeling better. Neck: No JVD Cardiac: RRR, no murmurs, rubs, or gallops.  Respiratory: Clear to auscultation bilaterally. GI: Soft, nontender, non-distended  MS: No edema  Assessment & Plan   Anterolateral STEMI -- cath 01/10/2025 100% mid LAD treated with 2.75 x24 mm Synergy XD stent. LVEDP 26 mmHg. Turtuous radial artery necessitating R femoral approach.  => Unfortunately, femoral approach complicated by significant right groin hematoma/scrotal hematoma -- Initial DAPT of ASA/Brilinta  converted to ASA/Plavix  with addition of Eliquis  after diagnosis of PE.,  -- plan is for triple therapy ASA, plavix  and Eliquis , will drop ASA at discharge given the need for DOAC -- continue metoprolol  succinate 50 mg daily and losartan  25 mg daily, atorvastatin  80mg  daily            Bilateral PE --  developed hypoxia and tachycardia. CTA 01/17/2025 showed bilateral PE, no evidence of right heart strain -- venous doppler showed LLE DVT  -- Acute VTE dosing eliquis , Eliquis  10mg  BID for 7 days (completed this morning), drop to 5mg  BID this evening    Acute HFmrEF ICM -- Echo 01/11/2025 showed EF 45-50% with wall LAD distribution wall motion abnormality, confirmed with follow-up limited echo in the setting of PE -- GDMT: continue Toprol , losartan , jardiance .             NSVT -- no significant ventricular ectopy on tele   HLD -- Started on high-dose atorvastatin  80 mg daily -- FLP/LFTs in 8 weeks   Right groin hematoma/scrotal hematoma -- CT abdomen pelvis showed no retroperitoneal hemorrhage, 6.5 x 4.7 x 11 cm right groin/inguinal canal hematoma with edema -- Repeat CT 1/28 with R inguinal canal hematoma similar slightly decreased since prior. No RP bleed.  -- Hgb with downward trend, but stable at 10 -- continue oxy IR 10mg  PRN, tylenol  500mg  QID and robaxin .    Leukocytosis -- completed empiric abx. CT of chest with bilateral lower lobe patchy changes -- WBC improving    Right renal nodule -- noted on CT, follow with PCP   PT/OT following, pending DC home with outpatient services. He  is actually up in the chair this morning and agrees to ambulate today. Asked RN to medicate prior to ambulation.    For questions or updates, please contact [facility] Please consult [email or web address] for contact info under         Signed, Manuelita Rummer, NP     ATTENDING ATTESTATION  I have seen, examined and evaluated the patient this morning on rounds along with Manuelita Rummer, NP.  After reviewing all the available data and chart, we discussed the patients laboratory, study & physical findings as well as symptoms in detail.  I agree with her findings, examination as well as impression recommendations as per our discussion.    Attending adjustments noted in italics.    He  looks and feels a lot better today.  Still having scrotal pain but was able to get up and walk around.  Feeling little better.  We had a long talk about the long road ahead of him recovering from multiple issues that it happened during his hospitalization and not the least of which was his MI and then PE.  His major complaint still remains that his scrotal pain.  He did note some exertional dyspnea which makes sense that she has been sedentary for the last week.  I really think that he would need outpatient PT and then once cleared by outpatient PT he should go to cardiac rehab.  I think this to be the best thing for him and provide psychological standpoint.  He is clearly concerned about going home because he [place] away so I think we still need 1 more day to monitor him but hopefully he will be stable and ready for discharge tomorrow.    [name], MD, MS Alm Clay, M.D., M.S. Interventional Cardiologist  [facility] Pager # [phone]

## 2025-01-25 ENCOUNTER — Other Ambulatory Visit (HOSPITAL_COMMUNITY): Payer: Self-pay

## 2025-01-25 DIAGNOSIS — D72829 Elevated white blood cell count, unspecified: Secondary | ICD-10-CM

## 2025-01-25 DIAGNOSIS — Z955 Presence of coronary angioplasty implant and graft: Secondary | ICD-10-CM

## 2025-01-25 LAB — BASIC METABOLIC PANEL WITH GFR
Anion gap: 9 (ref 5–15)
BUN: 11 mg/dL (ref 6–20)
CO2: 23 mmol/L (ref 22–32)
Calcium: 9 mg/dL (ref 8.9–10.3)
Chloride: 104 mmol/L (ref 98–111)
Creatinine, Ser: 0.72 mg/dL (ref 0.61–1.24)
GFR, Estimated: >60 mL/min
Glucose, Bld: 107 mg/dL — ABNORMAL HIGH (ref 70–99)
Potassium: 4.4 mmol/L (ref 3.5–5.1)
Sodium: 136 mmol/L (ref 135–145)

## 2025-01-25 LAB — CBC
HCT: 29.6 % — ABNORMAL LOW (ref 39.0–52.0)
Hemoglobin: 10.3 g/dL — ABNORMAL LOW (ref 13.0–17.0)
MCH: 33 pg (ref 26.0–34.0)
MCHC: 34.8 g/dL (ref 30.0–36.0)
MCV: 94.9 fL (ref 80.0–100.0)
Platelets: 598 10*3/uL — ABNORMAL HIGH (ref 150–400)
RBC: 3.12 MIL/uL — ABNORMAL LOW (ref 4.22–5.81)
RDW: 12.2 % (ref 11.5–15.5)
WBC: 23.8 10*3/uL — ABNORMAL HIGH (ref 4.0–10.5)
nRBC: 0 % (ref 0.0–0.2)

## 2025-01-25 MED ORDER — LIDOCAINE 5 % EX PTCH
1.0000 | MEDICATED_PATCH | CUTANEOUS | 0 refills | Status: AC
  Filled 2025-01-25: qty 30, 30d supply, fill #0

## 2025-01-25 MED ORDER — ATORVASTATIN CALCIUM 80 MG PO TABS
80.0000 mg | ORAL_TABLET | Freq: Every day | ORAL | 3 refills | Status: AC
  Filled 2025-01-25: qty 90, 90d supply, fill #0

## 2025-01-25 MED ORDER — OXYCODONE HCL 5 MG PO TABS
5.0000 mg | ORAL_TABLET | Freq: Two times a day (BID) | ORAL | 0 refills | Status: AC | PRN
  Filled 2025-01-25: qty 10, 5d supply, fill #0

## 2025-01-25 MED ORDER — METOPROLOL SUCCINATE ER 50 MG PO TB24
50.0000 mg | ORAL_TABLET | Freq: Every day | ORAL | 3 refills | Status: AC
  Filled 2025-01-25: qty 90, 90d supply, fill #0

## 2025-01-25 MED ORDER — NITROGLYCERIN 0.4 MG SL SUBL
0.4000 mg | SUBLINGUAL_TABLET | SUBLINGUAL | 3 refills | Status: AC | PRN
  Filled 2025-01-25: qty 25, 5d supply, fill #0

## 2025-01-25 MED ORDER — CLOPIDOGREL BISULFATE 75 MG PO TABS
75.0000 mg | ORAL_TABLET | Freq: Every day | ORAL | 1 refills | Status: AC
  Filled 2025-01-25: qty 90, 90d supply, fill #0

## 2025-01-25 MED ORDER — EMPAGLIFLOZIN 10 MG PO TABS
10.0000 mg | ORAL_TABLET | Freq: Every day | ORAL | 3 refills | Status: AC
  Filled 2025-01-25: qty 30, 30d supply, fill #0

## 2025-01-25 MED ORDER — APIXABAN 5 MG PO TABS
5.0000 mg | ORAL_TABLET | Freq: Two times a day (BID) | ORAL | 1 refills | Status: AC
  Filled 2025-01-25: qty 60, 30d supply, fill #0

## 2025-01-25 MED ORDER — LOSARTAN POTASSIUM 25 MG PO TABS
25.0000 mg | ORAL_TABLET | Freq: Every day | ORAL | 3 refills | Status: AC
  Filled 2025-01-25: qty 90, 90d supply, fill #0

## 2025-01-25 MED ORDER — METHOCARBAMOL 500 MG PO TABS
500.0000 mg | ORAL_TABLET | Freq: Two times a day (BID) | ORAL | 0 refills | Status: AC | PRN
  Filled 2025-01-25: qty 20, 10d supply, fill #0

## 2025-01-25 MED ORDER — PANTOPRAZOLE SODIUM 40 MG PO TBEC
40.0000 mg | DELAYED_RELEASE_TABLET | Freq: Every day | ORAL | 3 refills | Status: AC
  Filled 2025-01-25: qty 90, 90d supply, fill #0

## 2025-01-25 NOTE — Plan of Care (Signed)
" °  Problem: Education: Goal: Knowledge of General Education information will improve Description: Including pain rating scale, medication(s)/side effects and non-pharmacologic comfort measures Outcome: Progressing   Problem: Clinical Measurements: Goal: Ability to maintain clinical measurements within normal limits will improve Outcome: Progressing   Problem: Clinical Measurements: Goal: Will remain free from infection Outcome: Progressing   Problem: Clinical Measurements: Goal: Diagnostic test results will improve Outcome: Progressing   Problem: Clinical Measurements: Goal: Respiratory complications will improve Outcome: Progressing   Problem: Clinical Measurements: Goal: Cardiovascular complication will be avoided Outcome: Progressing   Problem: Activity: Goal: Risk for activity intolerance will decrease Outcome: Progressing   Problem: Pain Managment: Goal: General experience of comfort will improve and/or be controlled Outcome: Progressing   Problem: Safety: Goal: Ability to remain free from injury will improve Outcome: Progressing   Problem: Skin Integrity: Goal: Risk for impaired skin integrity will decrease Outcome: Progressing   Problem: Activity: Goal: Ability to return to baseline activity level will improve Outcome: Progressing   Problem: Cardiovascular: Goal: Vascular access site(s) Level 0-1 will be maintained Outcome: Progressing   Problem: Health Behavior/Discharge Planning: Goal: Ability to safely manage health-related needs after discharge will improve Outcome: Progressing   "

## 2025-01-25 NOTE — Discharge Summary (Addendum)
 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  100% thrombotic occluded mid LAD lesion treated with 2.75 x 24 mm Synergy XD stent postdilated to 3.0 mm. 2.  LVEDP of 26 mmHg with ejection fraction of around 40% with anterior/apical hypokinesis 3.  Unable to place right radial sheath due to tortuous radial artery necessitating right femoral approach. 4.  POC lactate decreased from 2.16 to 1.59.   Diagnostic: Dominance: Right     Intervention     Echo: 01/11/2025: LVEF 45 to 50% with mild reduced function.  Mid and distal anterior septal, and mid inferoseptal and apical anterior as well as apical inferior and apical hypokinesis.  Normal diastolic parameters.  Normal RV size and function, normal RAP.SABRA  Aortic valve sclerosis without stenosis.  Normal mitral valve.   Echo (limited): 01/18/2025: EF 45 to 50%.  Mildly reduced function.  Apical septal, anterior and anterolateral, mid-apical anteroseptal and apical akinesis.  Normal RV.  AV sclerosis without stenosis.  Normal MV.  Normal RV size, function.  Normal RAP.  Normal RVSP.  _____________   History of Present Illness   Aaron [name] is a 59 y.o. male with PMH of hemorrhoid and recent tooth infection who was seen 01/10/2025 for the  evaluation of STEMI. Reported issues with intermittent bright red rectal bleeding in the setting of prolapsed hemorrhoid for which he was recently seen by general surgery but opted for medical management. Also reported a recent tooth infection back in December which he finished antibiotics.    Reported he was in his usual state of health until the day of admission. Was planning to meet some friends when he walked out to his car and experienced left sided chest pain with radiation down the left arm. EMS was called, on scene he was noted to be diaphoretic, clammy. C/o chest pressure. EKG @ 1333 initially showed sinus rhythm with j point elevation with repeat @1404  showing ST elevation in anterolateral leads. Given 324mg  ASA en route. Brought directly up to the cath lab for emergent cardiac cath => found to have LAD occlusion treated with DES stent.  Radial access was aborted due to extreme tortuosity.  Unfortunately subsequently developed significant groin hematoma with infiltration into the inguinal canal and scrotum.  He then suffered bilateral PE due to immobility.  Started on DOAC, and aspirin  and Brilinta  converted to aspirin /Plavix .   Hospital Course   Consultants: [facility])  Anterolateral STEMI -- cath 01/10/2025 100% mid LAD treated with 2.75 x24 mm Synergy XD stent. LVEDP 26 mmHg. Turtuous radial artery necessitating R femoral approach.  => Unfortunately, femoral approach complicated by significant right groin hematoma/scrotal hematoma -- Initial DAPT of ASA/Brilinta  converted to ASA/Plavix  with addition of Eliquis  after diagnosis of PE.,  -- plan is for triple therapy ASA, plavix  and Eliquis , will drop  ASA at discharge given the need for DOAC -- continue metoprolol  succinate 50 mg daily and losartan  25 mg daily, atorvastatin  80mg  daily   Thankfully, since his MI and then PE, he has been relatively stable cardiac standpoint with no heart failure or angina.          DVT (LLE)/Bilateral PE  --  developed hypoxia and tachycardia. CTA 01/17/2025 showed bilateral PE, no evidence of right heart strain -- venous doppler showed LLE DVT  -- Acute VTE dosing eliquis , Eliquis  10mg  BID for 7 days (completed while inpatient), dropped to 5mg  BID   Acute HFmrEF/ ICM -- Echo 01/11/2025 showed EF 45-50% with wall LAD distribution wall motion abnormality, confirmed with follow-up limited echo in the setting of PE -- GDMT: continue Toprol , losartan , jardiance . => Would like to titrate beta-blocker in the outpatient setting, and I suspect a lot of his tachycardia is due to discomfort.             NSVT -- no significant ventricular ectopy on tele   HLD -- Started on high-dose atorvastatin  80 mg daily -- FLP/LFTs in 8 weeks   Right groin hematoma/scrotal hematoma -- CT abdomen pelvis showed no retroperitoneal hemorrhage, 6.5 x 4.7 x 11 cm right groin/inguinal canal hematoma with edema -- Repeat CT 1/28 with R inguinal canal hematoma similar slightly decreased since prior. No RP bleed.  -- Hgb with downward trend, but stable at 10 -- given 5 days of oxy 5mg  BID PRN (10 tabs) no refills at discharge. PDMP checked prior to Rx  = Discussed with vascular surgery for recommendations with the STEMI complications, and the only recommendation is elevate the scrotum was possible while seated but continued ambulation to help clear the inguinal swelling which would then reduce to scrotal swelling.   Leukocytosis -- completed empiric abx. CT of chest with bilateral lower lobe patchy changes -- WBC improving    Right renal nodule -- noted on CT, follow with PCP   General: Well developed, well nourished, male appearing in no acute distress. Head: Normocephalic, atraumatic.  Neck: Supple without bruits, JVD. Lungs:  Resp regular and unlabored, CTA. Heart: RRR, S1, S2, no S3, S4, or murmur; no rub. Abdomen: Soft, non-tender, non-distended with normoactive bowel sounds.;  The right groin hematoma is stable,  soft.  Scrotum is still tense and firm/swollen but notably less tender. Extremities: No clubbing, cyanosis, edema. Distal pedal pulses are 2+ bilaterally. Right radial cath site stable with bruising but no hematoma. Scrotal edema present but improving.  Neuro: Alert and oriented X 3. Moves all extremities spontaneously. Psych: Normal affect.  Actually in good spirits today happy and excited to go home.  Patient was seen by myself and Dr. Anner prior to discharge and deemed stable.  Follow-up arranged in the office.  Medication sent to [facility] pharmacy.  Educated by [employer].D.  Outpatient PT arranged by case management prior to discharge.  Did the patient have an acute coronary syndrome (MI, NSTEMI, STEMI, etc) this admission?:  Yes                               AHA/ACC ACS Clinical Performance & Quality Measures: Aspirin  prescribed? - No - needs DOAC ADP Receptor Inhibitor (Plavix /Clopidogrel , Brilinta /Ticagrelor  or Effient/Prasugrel) prescribed (includes medically managed patients)? - Yes Beta Blocker prescribed? - Yes High Intensity Statin (Lipitor 40-80mg  or Crestor 20-40mg ) prescribed? - Yes EF assessed during THIS hospitalization? - Yes For EF <40%, was  ACEI/ARB prescribed? - Yes For EF <40%, Aldosterone Antagonist (Spironolactone or Eplerenone) prescribed? - No - Reason:  consider outpatient Cardiac Rehab Phase II ordered (including medically managed patients)? - Yes       The patient will be scheduled for a TOC follow up appointment in 10-14 days.  A message has been sent to the [facility] and Scheduling Pool at the office where the patient should be seen for follow up.  _____________  Discharge Vitals Blood pressure 124/74, pulse (!) 105, temperature (!) 97.4 F (36.3 C), temperature source Oral, resp. rate 20, height 6' (1.829 m), weight 84.6 kg, SpO2 96%.  Filed Weights   01/10/25 1400 01/11/25 2035 01/23/25 0423  Weight: 81.6 kg 81.6 kg 84.6 kg    Labs & Radiologic Studies   CBC Recent Labs    01/24/25 0553 01/25/25 0458  WBC 21.1* 23.8*  HGB 10.4* 10.3*  HCT 29.6* 29.6*  MCV 93.4 94.9  PLT 576* 598*   Basic Metabolic Panel Recent Labs    97/95/73 0553 01/25/25 0458  NA 136 136  K 3.9 4.4  CL 102 104  CO2 25 23  GLUCOSE 116* 107*  BUN 13 11  CREATININE 0.75 0.72  CALCIUM  9.1 9.0   Liver Function Tests No results for input(s): AST, ALT, ALKPHOS, BILITOT, PROT, ALBUMIN in the last 72 hours. No results for input(s): LIPASE, AMYLASE in the last 72 hours. High Sensitivity Troponin:   No results for input(s): TROPONINIHS in the last 720 hours.  Recent Labs  Lab 01/10/25 1438 01/11/25 0029 01/18/25 1403 01/18/25 1831  TRNPT 25* 9,434* 908* 949*    BNP Invalid input(s): POCBNP No results for input(s): PROBNP in the last 72 hours.  No results for input(s): BNP in the last 72 hours.  D-Dimer No results for input(s): DDIMER in the last 72 hours. Hemoglobin A1C No results for input(s): HGBA1C in the last 72 hours. Fasting Lipid Panel No results for input(s): CHOL, HDL, LDLCALC, TRIG, CHOLHDL, LDLDIRECT in the last 72 hours. Lipoprotein (a)  Date/Time Value Ref Range Status  01/11/2025 12:29 AM 121.2 (H) <75.0 nmol/L Final    Comment:    (NOTE) This test was developed and its performance characteristics determined by [facility]. It has not been cleared or approved by the Food and Drug Administration. Note:  Values greater than or equal to 75.0 nmol/L may       indicate an independent risk factor for CHD,       but must be evaluated with caution when applied       to non-Caucasian populations due to the       influence of genetic factors on Lp(a) across       ethnicities. Performed At: [facility] [address] [place], [place] [id number] Jennette Shorter MD Ey:[id number]     Thyroid  Function Tests No results for input(s): TSH, T4TOTAL, T3FREE, THYROIDAB in the last 72  hours.  Invalid input(s): FREET3 _____________   VAS US  LOWER EXTREMITY VENOUS (DVT) Result Date: 01/18/2025  Lower Venous DVT Study Patient Name:  QUINNTON BURY  Date of Exam:   01/18/2025 Medical Rec #: [id number]        Accession #:    [id number] Date of Birth: 07/15/1966         Patient Gender: M Patient Age:   32 years Exam Location:  [facility] Procedure:      VAS US  LOWER EXTREMITY VENOUS (DVT) Referring Phys: MANUELITA ROBERTS --------------------------------------------------------------------------------  Indications: Pulmonary embolism.  Comparison Study: No prior exam. Performing Technologist: Edilia Elden Appl  Examination Guidelines: A complete evaluation includes B-mode imaging, spectral Doppler, color Doppler, and power Doppler as needed of all accessible portions of each vessel. Bilateral testing is considered an integral part of a complete examination. Limited examinations for reoccurring indications may be performed as noted. The reflux portion of the exam is performed with the patient in reverse Trendelenburg.  +---------+---------------+---------+-----------+----------+--------------+ RIGHT    CompressibilityPhasicitySpontaneityPropertiesThrombus Aging +---------+---------------+---------+-----------+----------+--------------+ CFV      Full           Yes      Yes                                 +---------+---------------+---------+-----------+----------+--------------+ SFJ      Full           Yes      Yes                                 +---------+---------------+---------+-----------+----------+--------------+ FV Prox  Full                                                        +---------+---------------+---------+-----------+----------+--------------+ FV Mid   Full                                                        +---------+---------------+---------+-----------+----------+--------------+ FV DistalFull                                                         +---------+---------------+---------+-----------+----------+--------------+ PFV      Full           Yes      Yes                                 +---------+---------------+---------+-----------+----------+--------------+ POP      Full           Yes      Yes                                 +---------+---------------+---------+-----------+----------+--------------+ PTV      Full                                                        +---------+---------------+---------+-----------+----------+--------------+ PERO     Full                                                        +---------+---------------+---------+-----------+----------+--------------+   +---------+---------------+---------+-----------+----------+--------------+  LEFT     CompressibilityPhasicitySpontaneityPropertiesThrombus Aging +---------+---------------+---------+-----------+----------+--------------+ CFV      Full           Yes      Yes                                 +---------+---------------+---------+-----------+----------+--------------+ SFJ      Full           Yes      Yes                                 +---------+---------------+---------+-----------+----------+--------------+ FV Prox  Full                                                        +---------+---------------+---------+-----------+----------+--------------+ FV Mid   Full                                                        +---------+---------------+---------+-----------+----------+--------------+ FV DistalFull                                                        +---------+---------------+---------+-----------+----------+--------------+ PFV      Full                                                        +---------+---------------+---------+-----------+----------+--------------+ POP      Full           Yes      Yes                  Rouleaux flow   +---------+---------------+---------+-----------+----------+--------------+ PTV      Full                                                        +---------+---------------+---------+-----------+----------+--------------+ PERO     None           No       No                                  +---------+---------------+---------+-----------+----------+--------------+ Deep vein thrombosis noted in one of the paired peroneal veins.    Summary: RIGHT: - There is no evidence of deep vein thrombosis in the lower extremity.  - No cystic structure found in the popliteal fossa.  LEFT: - Findings consistent with acute deep vein thrombosis involving the left popliteal vein.  - No cystic structure found in the popliteal fossa.  *See table(s) above for  measurements and observations. Electronically signed by Norman Serve on 01/18/2025 at 4:03:53 PM.    Final    CT Angio Chest Pulmonary Embolism (PE) W or WO Contrast Result Date: 01/17/2025 CLINICAL DATA:  Concern for pulmonary embolism. Retroperitoneal bleed. EXAM: CT ANGIOGRAPHY CHEST CT ABDOMEN AND PELVIS WITH CONTRAST TECHNIQUE: Multidetector CT imaging of the chest was performed using the standard protocol during bolus administration of intravenous contrast. Multiplanar CT image reconstructions and MIPs were obtained to evaluate the vascular anatomy. Multidetector CT imaging of the abdomen and pelvis was performed using the standard protocol during bolus administration of intravenous contrast. RADIATION DOSE REDUCTION: This exam was performed according to the departmental dose-optimization program which includes automated exposure control, adjustment of the mA and/or kV according to patient size and/or use of iterative reconstruction technique. CONTRAST:  75mL OMNIPAQUE  IOHEXOL  350 MG/ML SOLN COMPARISON:  CT abdomen pelvis dated 01/11/2025. FINDINGS: CTA CHEST FINDINGS Cardiovascular: There is no cardiomegaly. Trace pericardial effusion. The thoracic aorta is  unremarkable. The origins of the great vessels of the aortic arch are patent. Bilateral pulmonary artery emboli involving the lobar branches of the lower lobes bilaterally extending into the segmental branches. Additional smaller pulmonary artery emboli involving this segmental branches of the upper lobes. No CT evidence of right heart strain. Mediastinum/Nodes: No hilar or mediastinal adenopathy. The esophagus is grossly unremarkable. No mediastinal fluid collection. Lungs/Pleura: Bilateral lower lobe patchy consolidative changes may represent atelectasis or pneumonia versus areas of developing infarct. Trace bilateral pleural effusions noted. No pneumothorax. The central airways are patent. Musculoskeletal: No acute osseous pathology. Review of the MIP images confirms the above findings. CT ABDOMEN and PELVIS FINDINGS No intra-abdominal free air or free fluid. Hepatobiliary: Small liver cysts. No biliary dilatation. The gallbladder is unremarkable. Pancreas: Unremarkable. No pancreatic ductal dilatation or surrounding inflammatory changes. Spleen: Normal in size without focal abnormality. Adrenals/Urinary Tract: A subcentimeter indeterminate right adrenal nodule. There is no hydronephrosis on either side. There is symmetric enhancement and excretion of contrast by both kidneys. The visualized ureters and urinary bladder appear unremarkable. Stomach/Bowel: Sigmoid diverticulosis. There is moderate stool throughout the colon. There is no bowel obstruction or active inflammation. The appendix is normal. Vascular/Lymphatic: Mild aortoiliac atherosclerotic disease. The IVC is unremarkable. No portal venous gas. There is no adenopathy. Reproductive: The prostate and seminal vesicles are grossly unremarkable. Diffuse scrotal wall swelling and edema. Other: Right inguinal canal hematoma similar or slightly decreased since the prior CT. No retroperitoneal bleed. Musculoskeletal: No acute osseous pathology. Review of the  MIP images confirms the above findings. IMPRESSION: 1. Bilateral pulmonary artery emboli. No CT evidence of right heart strain. 2. Bilateral lower lobe patchy consolidative changes may represent atelectasis or pneumonia versus areas of developing infarct. 3. Right inguinal canal hematoma similar or slightly decreased since the prior CT. No retroperitoneal bleed. 4. Sigmoid diverticulosis. No bowel obstruction. Normal appendix. 5.  Aortic Atherosclerosis (ICD10-I70.0). These results will be called to the ordering clinician or representative by the Radiologist Assistant, and communication documented in the PACS or [employer]. Electronically Signed   By: Vanetta Chou M.D.   On: 01/17/2025 16:52   CT ABDOMEN PELVIS W CONTRAST Result Date: 01/17/2025 CLINICAL DATA:  Concern for pulmonary embolism. Retroperitoneal bleed. EXAM: CT ANGIOGRAPHY CHEST CT ABDOMEN AND PELVIS WITH CONTRAST TECHNIQUE: Multidetector CT imaging of the chest was performed using the standard protocol during bolus administration of intravenous contrast. Multiplanar CT image reconstructions and MIPs were obtained to evaluate the vascular anatomy. Multidetector CT imaging of  the abdomen and pelvis was performed using the standard protocol during bolus administration of intravenous contrast. RADIATION DOSE REDUCTION: This exam was performed according to the departmental dose-optimization program which includes automated exposure control, adjustment of the mA and/or kV according to patient size and/or use of iterative reconstruction technique. CONTRAST:  75mL OMNIPAQUE  IOHEXOL  350 MG/ML SOLN COMPARISON:  CT abdomen pelvis dated 01/11/2025. FINDINGS: CTA CHEST FINDINGS Cardiovascular: There is no cardiomegaly. Trace pericardial effusion. The thoracic aorta is unremarkable. The origins of the great vessels of the aortic arch are patent. Bilateral pulmonary artery emboli involving the lobar branches of the lower lobes bilaterally extending into  the segmental branches. Additional smaller pulmonary artery emboli involving this segmental branches of the upper lobes. No CT evidence of right heart strain. Mediastinum/Nodes: No hilar or mediastinal adenopathy. The esophagus is grossly unremarkable. No mediastinal fluid collection. Lungs/Pleura: Bilateral lower lobe patchy consolidative changes may represent atelectasis or pneumonia versus areas of developing infarct. Trace bilateral pleural effusions noted. No pneumothorax. The central airways are patent. Musculoskeletal: No acute osseous pathology. Review of the MIP images confirms the above findings. CT ABDOMEN and PELVIS FINDINGS No intra-abdominal free air or free fluid. Hepatobiliary: Small liver cysts. No biliary dilatation. The gallbladder is unremarkable. Pancreas: Unremarkable. No pancreatic ductal dilatation or surrounding inflammatory changes. Spleen: Normal in size without focal abnormality. Adrenals/Urinary Tract: A subcentimeter indeterminate right adrenal nodule. There is no hydronephrosis on either side. There is symmetric enhancement and excretion of contrast by both kidneys. The visualized ureters and urinary bladder appear unremarkable. Stomach/Bowel: Sigmoid diverticulosis. There is moderate stool throughout the colon. There is no bowel obstruction or active inflammation. The appendix is normal. Vascular/Lymphatic: Mild aortoiliac atherosclerotic disease. The IVC is unremarkable. No portal venous gas. There is no adenopathy. Reproductive: The prostate and seminal vesicles are grossly unremarkable. Diffuse scrotal wall swelling and edema. Other: Right inguinal canal hematoma similar or slightly decreased since the prior CT. No retroperitoneal bleed. Musculoskeletal: No acute osseous pathology. Review of the MIP images confirms the above findings. IMPRESSION: 1. Bilateral pulmonary artery emboli. No CT evidence of right heart strain. 2. Bilateral lower lobe patchy consolidative changes may  represent atelectasis or pneumonia versus areas of developing infarct. 3. Right inguinal canal hematoma similar or slightly decreased since the prior CT. No retroperitoneal bleed. 4. Sigmoid diverticulosis. No bowel obstruction. Normal appendix. 5.  Aortic Atherosclerosis (ICD10-I70.0). These results will be called to the ordering clinician or representative by the Radiologist Assistant, and communication documented in the PACS or [employer]. Electronically Signed   By: Vanetta Chou M.D.   On: 01/17/2025 16:52   CT ABDOMEN PELVIS WO CONTRAST Result Date: 01/11/2025 CLINICAL DATA:  Recent cardiac catheterization. Right femoral hematoma with scrotal swelling. Evaluate for retroperitoneal hemorrhage. EXAM: CT ABDOMEN AND PELVIS WITHOUT CONTRAST TECHNIQUE: Multidetector CT imaging of the abdomen and pelvis was performed following the standard protocol without IV contrast. RADIATION DOSE REDUCTION: This exam was performed according to the departmental dose-optimization program which includes automated exposure control, adjustment of the mA and/or kV according to patient size and/or use of iterative reconstruction technique. COMPARISON:  None Available. FINDINGS: Lower chest: No acute findings. Hepatobiliary: 2.3 cm left hepatic cyst. Scattered tiny hypodensities in the liver parenchyma are too small to characterize but are statistically most likely benign. No followup imaging is recommended. High density material in the lumen of the gallbladder is likely vicarious excretion of contrast. No intrahepatic or extrahepatic biliary dilation. Pancreas: No focal mass lesion. No dilatation of  the main duct. No intraparenchymal cyst. No peripancreatic edema. Spleen: No splenomegaly. No suspicious focal mass lesion. Adrenals/Urinary Tract: Left adrenal gland unremarkable. 8 mm right adrenal nodule cannot be definitively characterized but is likely benign. Right kidney unremarkable. Punctate stone identified lower pole  left kidney without hydronephrosis. No evidence for hydroureter. The urinary bladder appears normal for the degree of distention. Anterior left bladder protrudes towards the left groin hernia. Stomach/Bowel: Moderate distention of the stomach with food and fluid. Duodenum is normally positioned as is the ligament of Treitz. No small bowel wall thickening. No small bowel dilatation. The terminal ileum is normal. The appendix is normal. No gross colonic mass. No colonic wall thickening. Diverticular changes are noted in the left colon without evidence of diverticulitis. Vascular/Lymphatic: There is mild atherosclerotic calcification of the abdominal aorta without aneurysm. There is no gastrohepatic or hepatoduodenal ligament lymphadenopathy. No retroperitoneal or mesenteric lymphadenopathy. No pelvic sidewall lymphadenopathy. Reproductive: The prostate gland and seminal vesicles are unremarkable. Other: No substantial intraperitoneal free fluid. Musculoskeletal: Left-sided groin hernia contains only fat. No retroperitoneal hemorrhage in the abdomen or pelvis. There is evidence of hemorrhage in the right groin region with apparent blood products in the right inguinal canal and a probable right inguinal hematoma measuring on the order of 6.5 x 4.7 x 11.0 cm. There is extensive edema in the right groin region and scrotum. IMPRESSION: 1. No evidence for retroperitoneal hemorrhage in the abdomen or pelvis. 2. 6.5 x 4.7 x 11.0 cm right groin/inguinal canal hematoma. There is extensive edema in the right groin region and scrotum. 3. Anterior left bladder protrudes towards the fat containing contralateral left groin hernia. 4. Left colonic diverticulosis without diverticulitis. 5. Punctate left renal stone. 6. 8 mm right adrenal nodule cannot be definitively characterized but is likely benign. 7.  Aortic Atherosclerosis (ICD10-I70.0). Electronically Signed   By: Camellia Candle M.D.   On: 01/11/2025 08:38    Disposition Pt  is being discharged home today in good condition.  Follow-up Plans & Appointments  Follow-up Information     [facility] Orth[facility] at [facility] Follow up.   Specialty: Rehabilitation Why: Outpatient Physical Therapy-office to call within 3-5 business days. If the office has not called; please call the office to schedule. Contact information: [address] [place]  [address] [phone]        Celestia Rosaline SQUIBB., NP Follow up.   Why: TIME : 10:00 AM   PLEASE ARRIVE AT 9:30 AM DATE : FEBRUARY 17 , 2026 TUESDAY  PLEASE BRING ALL CURRENT MEDICATION, ID and INS CARD, CO-PAY Contact information: [facility]         [facility] [address] [place], [place] [address] Phone # [phone]               Discharge Instructions     Amb Referral to Cardiac Rehabilitation   Complete by: As directed    Diagnosis:  STEMI Coronary Stents     After initial evaluation and assessments completed: Virtual Based Care may be provided alone or in conjunction with Phase 2 Cardiac Rehab based on patient barriers.: Yes   Intensive Cardiac Rehabilitation (ICR) [facility] location only OR Traditional Cardiac Rehabilitation (TCR) *If criteria for ICR are not met will enroll in TCR ([facility] only): Yes   Ambulatory referral to Physical Therapy   Complete by: As directed    Outpatient Physical Therapy-evaluation and treatment.   Call MD for:  difficulty breathing, headache or  visual disturbances   Complete by: As directed    Call MD for:  persistant dizziness or light-headedness   Complete by: As directed    Call MD for:  redness, tenderness, or signs of infection (pain, swelling, redness, odor or green/yellow discharge around incision site)   Complete by: As directed    Discharge instructions   Complete by: As directed    Groin Site Care Refer to this sheet in the next few weeks. These instructions provide you with  information on caring for yourself after your procedure. Your caregiver may also give you more specific instructions. Your treatment has been planned according to current medical practices, but problems sometimes occur. Call your caregiver if you have any problems or questions after your procedure. HOME CARE INSTRUCTIONS You may shower 24 hours after the procedure. Remove the bandage (dressing) and gently wash the site with plain soap and water. Gently pat the site dry.  Do not apply powder or lotion to the site.  Do not sit in a bathtub, swimming pool, or whirlpool for 5 to 7 days.  No bending, squatting, or lifting anything over 10 pounds (4.5 kg) as directed by your caregiver.  Inspect the site at least twice daily.  Do not drive home if you are discharged the same day of the procedure. Have someone else drive you.  You may drive 24 hours after the procedure unless otherwise instructed by your caregiver.  What to expect: Any bruising will usually fade within 1 to 2 weeks.  Blood that collects in the tissue (hematoma) may be painful to the touch. It should usually decrease in size and tenderness within 1 to 2 weeks.  SEEK IMMEDIATE MEDICAL CARE IF: You have unusual pain at the groin site or down the affected leg.  You have redness, warmth, swelling, or pain at the groin site.  You have drainage (other than a small amount of blood on the dressing).  You have chills.  You have a fever or persistent symptoms for more than 72 hours.  You have a fever and your symptoms suddenly get worse.  Your leg becomes pale, cool, tingly, or numb.  You have heavy bleeding from the site. Hold pressure on the site. SABRA  PLEASE DO NOT MISS ANY DOSES OF YOUR PLAVIX !!!!! Also keep a log of you blood pressures and bring back to your follow up appt. Please call the office with any questions.   Patients taking blood thinners should generally stay away from medicines like ibuprofen, Advil, Motrin, naproxen, and Aleve  due to risk of stomach bleeding. You may take Tylenol  as directed or talk to your primary doctor about alternatives.   PLEASE ENSURE THAT YOU DO NOT RUN OUT OF YOUR PLAVIX . This medication is very important to remain on for at least one year. IF you have issues obtaining this medication due to cost please CALL the office 3-5 business days prior to running out in order to prevent missing doses of this medication.   Increase activity slowly   Complete by: As directed        Discharge Medications Allergies as of 01/25/2025   No Known Allergies      Medication List     STOP taking these medications    hydrocortisone  2.5 % rectal cream Commonly known as: ANUSOL -HC   lidocaine  2 % solution Commonly known as: XYLOCAINE        TAKE these medications    apixaban  5 MG Tabs tablet Commonly known as: ELIQUIS  Take  1 tablet (5 mg total) by mouth 2 (two) times daily.   atorvastatin  80 MG tablet Commonly known as: LIPITOR Take 1 tablet (80 mg total) by mouth daily.   clopidogrel  75 MG tablet Commonly known as: PLAVIX  Take 1 tablet (75 mg total) by mouth daily.   empagliflozin  10 MG Tabs tablet Commonly known as: JARDIANCE  Take 1 tablet (10 mg total) by mouth daily.   lidocaine  5 % Commonly known as: LIDODERM  Place 1 patch onto the skin daily. Remove & Discard patch within 12 hours or as directed by MD   losartan  25 MG tablet Commonly known as: COZAAR  Take 1 tablet (25 mg total) by mouth daily.   methocarbamol  500 MG tablet Commonly known as: ROBAXIN  Take 1 tablet (500 mg total) by mouth 2 (two) times daily as needed for muscle spasms.   metoprolol  succinate 50 MG 24 hr tablet Commonly known as: TOPROL -XL Take 1 tablet (50 mg total) by mouth at bedtime. Take with or immediately following a meal.   nitroGLYCERIN  0.4 MG SL tablet Commonly known as: NITROSTAT  Place 1 tablet (0.4 mg total) under the tongue every 5 (five) minutes as needed for chest pain. If you experience  chest pain, place one tablet under your tongue, you should feel relief 5 minutes after your first dose. You can take a dose every 5 minutes up to a total of 3 doses.  If you need a third dose, call 911.   oxyCODONE  5 MG immediate release tablet Commonly known as: Oxy IR/ROXICODONE  Take 1 tablet (5 mg total) by mouth 2 (two) times daily as needed for severe pain (pain score 7-10).   pantoprazole  40 MG tablet Commonly known as: PROTONIX  Take 1 tablet (40 mg total) by mouth daily.   psyllium 0.52 g capsule Commonly known as: REGULOID Take 0.52 g by mouth daily.         Outstanding Labs/Studies FLP/LFTs in 8 weeks BMET/CBC at follow up   Duration of Discharge Encounter: APP Time: 30 minutes   Signed, Manuelita Rummer, NP 01/25/2025, 9:36 AM    ATTENDING ATTESTATION  I have seen, examined and evaluated the patient this morning on rounds along with Manuelita Rummer, NP.  I actually personally performed the interview and physical exam today and discussed my findings and recommendations with Manuelita Rummer, NP and our clinical pharmacy team.   After reviewing all the available data and chart, we discussed the patients laboratory, study & physical findings as well as symptoms in detail.  We also discussed general summary of the patient's hospitalization along with pertinent findings, impressions and recommendations.  I agree with the findings, examination, impression recommendations and hospital summary as noted above.  Attending adjustments noted in italics.   Prolonged, difficult hospitalization with a sizable anterior infarct with LAD PCI complicated by groin hematoma at access site with scrotal swelling but limited his mobility.  Subsequently developed DVT with bilateral PE and has been converted to DOAC plus DAPT with plans to continue aspirin  only through his hospitalization and then stop.  He will then continue with Plavix  plus DOAC. We have kept his medication stable while we are  weaning off his pain medications.  His blood pressures have been relatively stable, but he has remained tachycardic.  He is on a beta-blocker which will recommend increasing the dose in the outpatient setting.  But based on his generalized weakness and fatigue, we felt that some of this was compensatory and did not want to hinder his ambulation.  He can  be additionally titrated in the outpatient setting.  With mild reduced EF, could consider converting ARB to Entresto in the outpatient setting.   He has been extremely weak but mostly sedentary which was likely an exacerbating component of his DVT-PE.  Since that he has been a bit less active until the last couple days.  He is now more active, plans for outpatient PT.  Today he was in good spirits and eager to leave-hoping to get home before bed weather since then again.  I personally spent 15 minutes in direct patient consultation today and additional 20 in discussion with treatment team including APP and pharmacist as well as social work.  Additional 10 minutes in charting.  Total time 45 minutes    [name], MD, MS Alm Clay, MD., M.S. Interventional Cardiologist  [facility]  Pager # [phone] Phone # [phone] [address] [place], [place] [address]       "

## 2025-01-25 NOTE — TOC Transition Note (Signed)
 Transition of Care [facility]) - Discharge Note   Patient Details  Name: Aaron [name] MRN: [id number] Date of Birth: 10/12/1966  Transition of Care [facility]) CM/SW Contact:  Sudie Erminio Deems, RN Phone Number: 01/25/2025, 10:31 AM   Clinical Narrative: Patient plans for discharge home today. Patient has been arranged with outpatient PT at the [facility]. Office will call the patient with visit times. DME rollator ordered via [employer] and it will be delivered to the room. Patient's mom to assist with transportation home. No further needs identified at this time.     Final next level of care: OP Rehab Barriers to Discharge: No Barriers Identified   Patient Goals and CMS Choice Patient states their goals for this hospitalization and ongoing recovery are:: to return home would like to get outpatient PT   Choice offered to / list presented to : Patient  Discharge Plan and Services Additional resources added to the After Visit Summary for   In-house Referral: Clinical Social Work Discharge Planning Services: CM Consult Post Acute Care Choice: NA            DME Agency: NA  Social Drivers of Health (SDOH) Interventions SDOH Screenings   Food Insecurity: No Food Insecurity (01/11/2025)  Housing: Low Risk (01/11/2025)  Transportation Needs: No Transportation Needs (01/11/2025)  Utilities: Not At Risk (01/11/2025)  Tobacco Use: High Risk (01/11/2025)     Readmission Risk Interventions     No data to display

## 2025-01-26 ENCOUNTER — Telehealth (HOSPITAL_COMMUNITY): Payer: Self-pay

## 2025-01-26 NOTE — Telephone Encounter (Signed)
 Attempted to call patient in regards to Cardiac Rehab - LM on VM

## 2025-02-02 ENCOUNTER — Ambulatory Visit: Admitting: Physician Assistant

## 2025-02-06 ENCOUNTER — Ambulatory Visit: Payer: Self-pay | Admitting: Primary Care
